# Patient Record
Sex: Female | Born: 1955
Health system: Southern US, Community
[De-identification: ages and names within clinical notes are randomized; demographics above are authoritative.]

## PROBLEM LIST (undated history)

## (undated) DIAGNOSIS — E785 Hyperlipidemia, unspecified: Secondary | ICD-10-CM

## (undated) DIAGNOSIS — F419 Anxiety disorder, unspecified: Secondary | ICD-10-CM

## (undated) DIAGNOSIS — F320 Major depressive disorder, single episode, mild: Secondary | ICD-10-CM

## (undated) HISTORY — DX: Hyperlipidemia, unspecified: E78.5

## (undated) HISTORY — PX: CYSTECTOMY: SUR359

## (undated) HISTORY — DX: Major depressive disorder, single episode, mild: F32.0

## (undated) HISTORY — PX: CARPAL TUNNEL RELEASE: SHX101

## (undated) HISTORY — PX: NERVE REPAIR: SHX2083

---

## 1978-11-28 HISTORY — PX: BONE TUMOR RESECTION: SHX1255

## 1990-03-29 HISTORY — PX: VAGINAL HYSTERECTOMY: SUR661

## 2004-11-12 ENCOUNTER — Ambulatory Visit: Payer: Self-pay | Admitting: Internal Medicine

## 2004-11-17 ENCOUNTER — Ambulatory Visit: Payer: Self-pay | Admitting: Internal Medicine

## 2004-12-18 ENCOUNTER — Ambulatory Visit: Payer: Self-pay | Admitting: Internal Medicine

## 2005-01-01 ENCOUNTER — Ambulatory Visit: Payer: Self-pay | Admitting: Internal Medicine

## 2005-02-03 ENCOUNTER — Ambulatory Visit: Payer: Self-pay | Admitting: Otolaryngology

## 2005-02-14 ENCOUNTER — Emergency Department: Payer: Self-pay | Admitting: General Practice

## 2006-03-04 IMAGING — US US THYROID
1 series · 17 of 25 positions shown · non-contrast
Comparison: none

REASON FOR EXAM: NODULE
COMMENTS:

[Series 1: us thyroid · 17 of 42 slices shown]
[im 1/42]
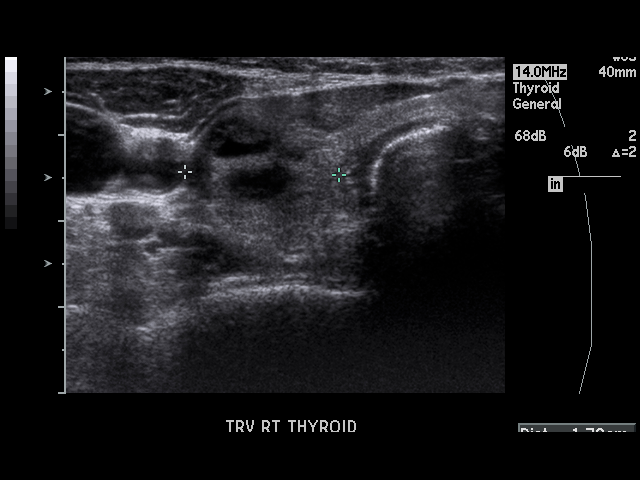
[im 4/42]
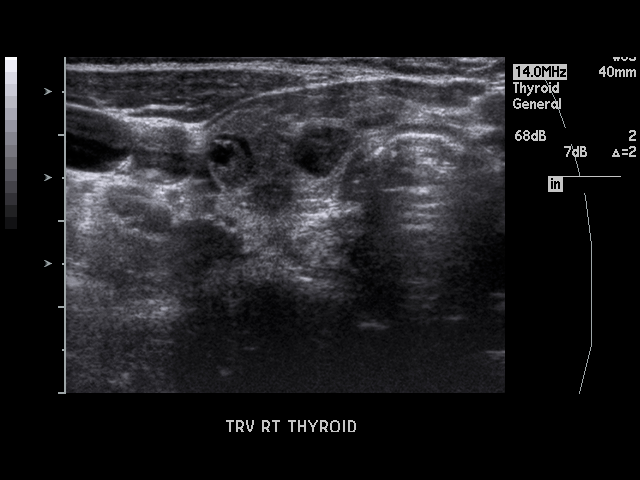
[im 6/42]
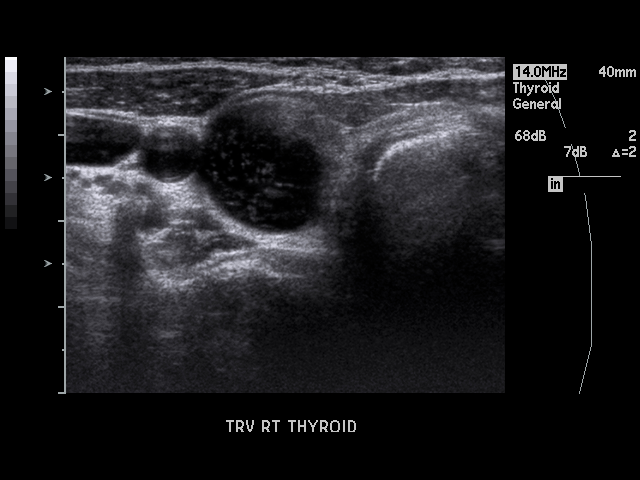
[im 9/42]
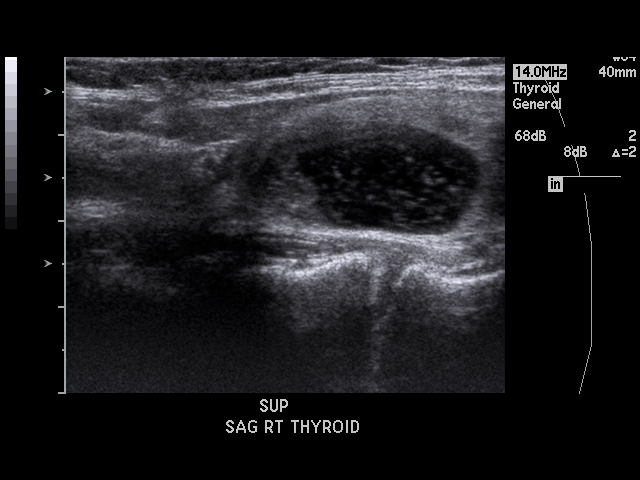
[im 11/42]
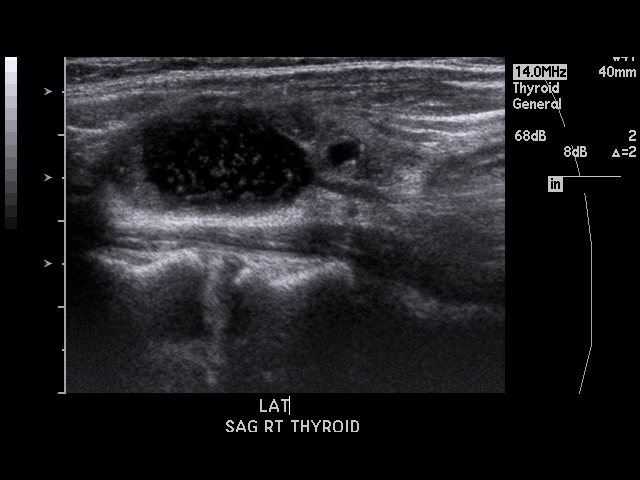
[im 14/42]
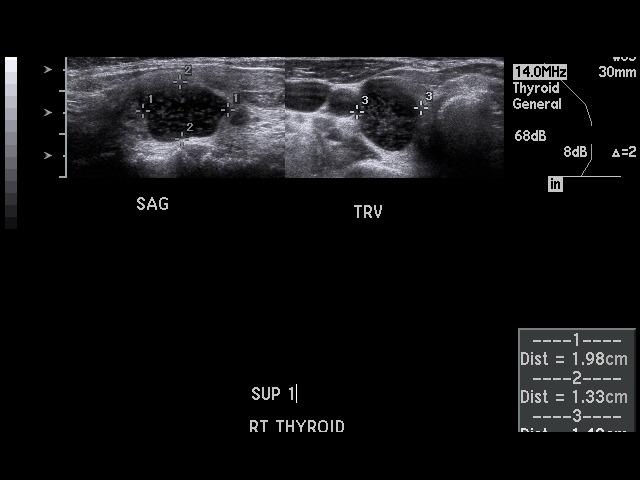
[im 16/42]
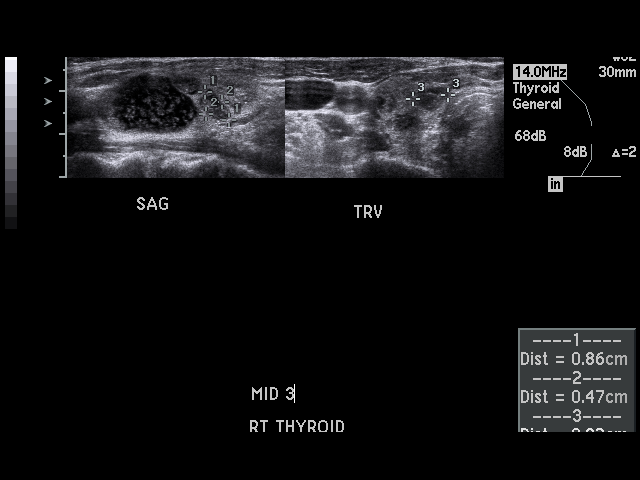
[im 19/42]
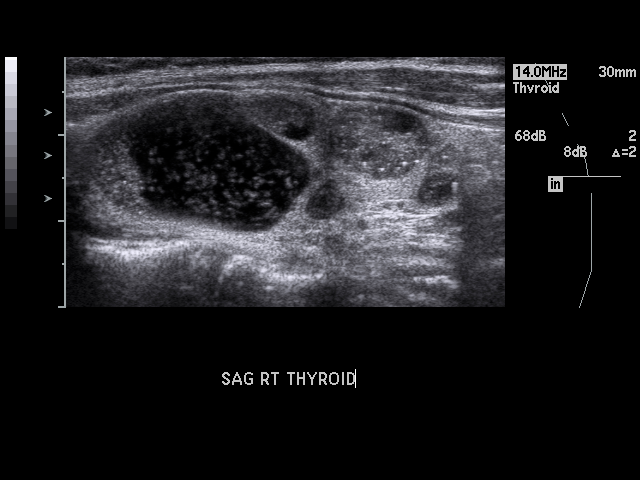
[im 21/42]
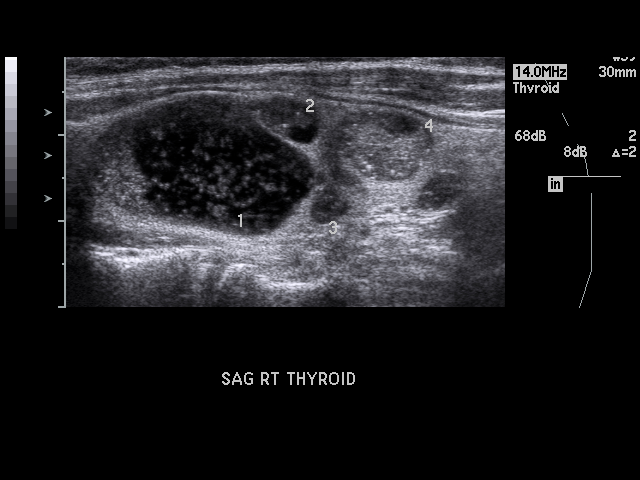
[im 23/42]
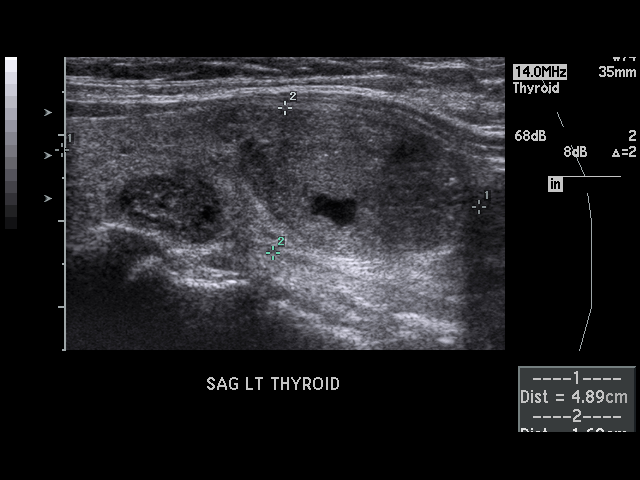
[im 26/42]
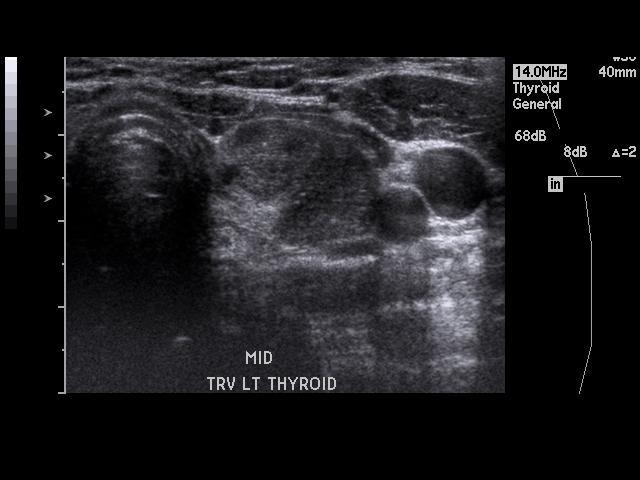
[im 28/42]
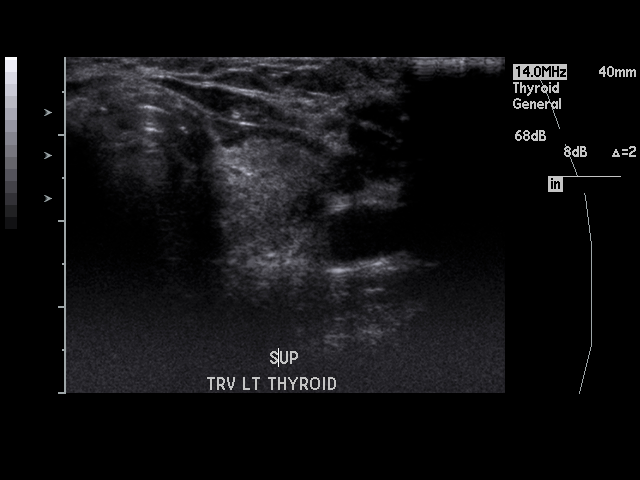
[im 31/42]
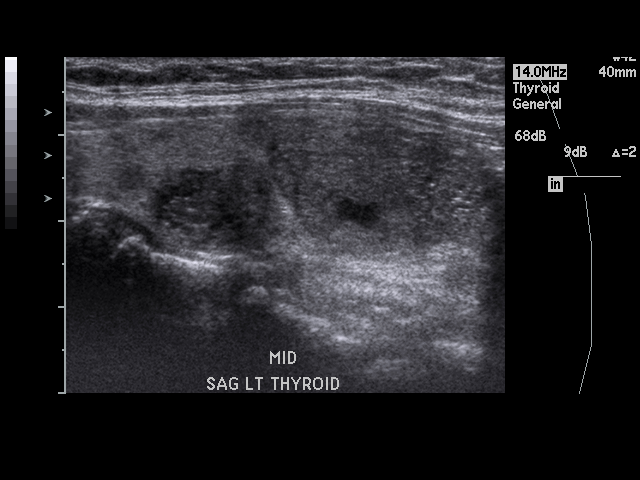
[im 33/42]
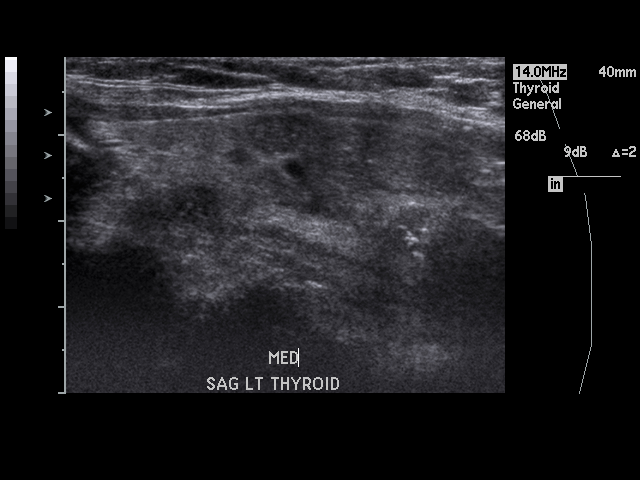
[im 36/42]
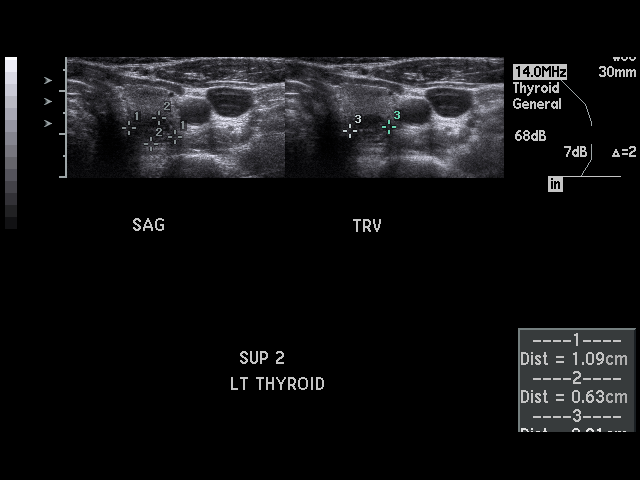
[im 38/42]
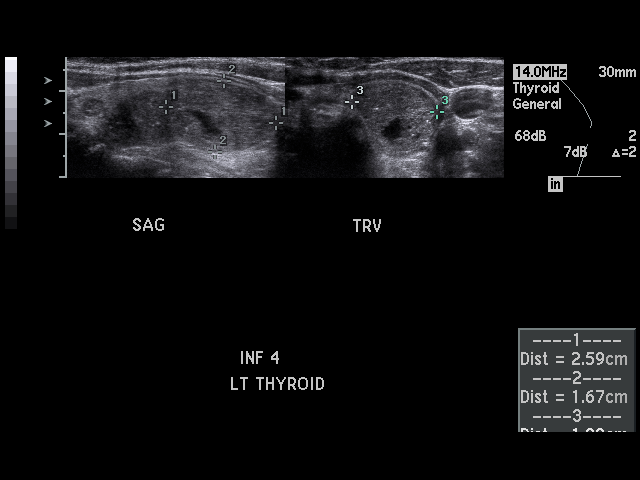
[im 42/42]
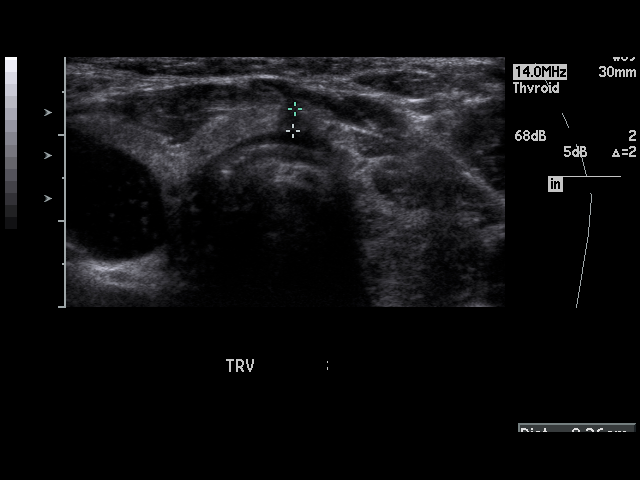

[17 of 25 positions shown; findings below may reference images not displayed]

PROCEDURE:     US  - US THYROID  - February 03, 2005  [DATE]

RESULT:     The RIGHT lobe of the thyroid measures 4.70 cm x 1.92 cm x
cm.  The LEFT lobe measures 4.89 cm x 1.69 cm x 1.89 cm.  In the superior
aspect of the RIGHT lobe there is a hypoechoic nodule having the appearance
of a cyst containing debris and measuring 1.98 cm at maximum diameter. There
are four additional small solid nodules in the RIGHT lobe, which measure
1.19 cm, .86 cm, .77 cm, and .68 cm at maximum diameters, respectively. In
the isthmus there is a 4.2 mm hypoechoic nodule.  In the LEFT lobe there are
noted four nodules with the largest measuring 2.59 cm at maximum diameter.
Additional solid nodules measuring 1.46 cm, 1.09 cm and .66 cm are seen.
IMPRESSION: 1)There are observed multiple thyroid nodules bilaterally as described
above. Additionally, a cystic appearing nodule is noted superiorly in the
RIGHT lobe.

## 2008-01-12 ENCOUNTER — Telehealth (INDEPENDENT_AMBULATORY_CARE_PROVIDER_SITE_OTHER): Payer: Self-pay | Admitting: *Deleted

## 2008-10-30 ENCOUNTER — Emergency Department: Payer: Self-pay | Admitting: Emergency Medicine

## 2008-11-05 ENCOUNTER — Ambulatory Visit: Payer: Self-pay | Admitting: Cardiology

## 2008-11-05 DIAGNOSIS — F411 Generalized anxiety disorder: Secondary | ICD-10-CM | POA: Insufficient documentation

## 2008-11-05 DIAGNOSIS — F17200 Nicotine dependence, unspecified, uncomplicated: Secondary | ICD-10-CM | POA: Insufficient documentation

## 2008-11-05 DIAGNOSIS — R079 Chest pain, unspecified: Secondary | ICD-10-CM | POA: Insufficient documentation

## 2008-11-06 ENCOUNTER — Telehealth (INDEPENDENT_AMBULATORY_CARE_PROVIDER_SITE_OTHER): Payer: Self-pay | Admitting: *Deleted

## 2008-11-07 ENCOUNTER — Ambulatory Visit: Payer: Self-pay

## 2008-11-07 ENCOUNTER — Encounter: Payer: Self-pay | Admitting: Cardiology

## 2008-11-08 ENCOUNTER — Encounter: Payer: Self-pay | Admitting: Cardiology

## 2008-11-13 ENCOUNTER — Encounter: Payer: Self-pay | Admitting: Internal Medicine

## 2008-11-13 ENCOUNTER — Ambulatory Visit: Payer: Self-pay | Admitting: Cardiovascular Disease

## 2008-11-13 DIAGNOSIS — R748 Abnormal levels of other serum enzymes: Secondary | ICD-10-CM | POA: Insufficient documentation

## 2008-11-14 LAB — CONVERTED CEMR LAB
ALT: 73 units/L — ABNORMAL HIGH (ref 0–35)
Albumin: 4.5 g/dL (ref 3.5–5.2)
CO2: 22 meq/L (ref 19–32)
Calcium: 9.6 mg/dL (ref 8.4–10.5)
Chloride: 108 meq/L (ref 96–112)
Cholesterol: 219 mg/dL — ABNORMAL HIGH (ref 0–200)
Glucose, Bld: 69 mg/dL — ABNORMAL LOW (ref 70–99)
MCV: 85.8 fL (ref 78.0–100.0)
Platelets: 258 10*3/uL (ref 150–400)
Potassium: 4.2 meq/L (ref 3.5–5.3)
RBC: 5.08 M/uL (ref 3.87–5.11)
Sodium: 145 meq/L (ref 135–145)
Total Bilirubin: 0.5 mg/dL (ref 0.3–1.2)
Total Protein: 6.8 g/dL (ref 6.0–8.3)
Triglycerides: 83 mg/dL (ref <150)
WBC: 7.3 10*3/uL (ref 4.0–10.5)

## 2008-11-20 ENCOUNTER — Ambulatory Visit: Payer: Self-pay | Admitting: Cardiology

## 2008-11-28 ENCOUNTER — Encounter: Payer: Self-pay | Admitting: Internal Medicine

## 2008-12-20 ENCOUNTER — Ambulatory Visit: Payer: Self-pay | Admitting: Internal Medicine

## 2008-12-20 DIAGNOSIS — E785 Hyperlipidemia, unspecified: Secondary | ICD-10-CM | POA: Insufficient documentation

## 2009-01-10 ENCOUNTER — Encounter: Payer: Self-pay | Admitting: Internal Medicine

## 2009-01-10 ENCOUNTER — Ambulatory Visit: Payer: Self-pay | Admitting: Internal Medicine

## 2009-01-15 ENCOUNTER — Encounter: Payer: Self-pay | Admitting: Internal Medicine

## 2009-02-25 ENCOUNTER — Encounter: Payer: Self-pay | Admitting: Internal Medicine

## 2010-10-13 ENCOUNTER — Encounter: Payer: Self-pay | Admitting: Cardiology

## 2011-07-12 ENCOUNTER — Ambulatory Visit (INDEPENDENT_AMBULATORY_CARE_PROVIDER_SITE_OTHER): Payer: BC Managed Care – PPO | Admitting: Internal Medicine

## 2011-07-12 ENCOUNTER — Encounter: Payer: Self-pay | Admitting: Internal Medicine

## 2011-07-12 VITALS — BP 100/60 | HR 66 | Temp 98.2°F | Ht 62.0 in | Wt 140.0 lb

## 2011-07-12 DIAGNOSIS — Z1231 Encounter for screening mammogram for malignant neoplasm of breast: Secondary | ICD-10-CM

## 2011-07-12 DIAGNOSIS — M7500 Adhesive capsulitis of unspecified shoulder: Secondary | ICD-10-CM

## 2011-07-12 DIAGNOSIS — R5383 Other fatigue: Secondary | ICD-10-CM | POA: Insufficient documentation

## 2011-07-12 DIAGNOSIS — Z23 Encounter for immunization: Secondary | ICD-10-CM

## 2011-07-12 DIAGNOSIS — E785 Hyperlipidemia, unspecified: Secondary | ICD-10-CM

## 2011-07-12 DIAGNOSIS — Z1211 Encounter for screening for malignant neoplasm of colon: Secondary | ICD-10-CM

## 2011-07-12 DIAGNOSIS — F17200 Nicotine dependence, unspecified, uncomplicated: Secondary | ICD-10-CM

## 2011-07-12 DIAGNOSIS — Z0000 Encounter for general adult medical examination without abnormal findings: Secondary | ICD-10-CM | POA: Insufficient documentation

## 2011-07-12 DIAGNOSIS — R5381 Other malaise: Secondary | ICD-10-CM

## 2011-07-12 NOTE — Assessment & Plan Note (Signed)
Seems to be healthy but stressed and overwhelmed with caregiving for parents (in separate apts in [place]) and 2 grandchildren Will get mammo and do stool immunoassay  Will set up biopsy of suspicious lesion

## 2011-07-12 NOTE — Progress Notes (Signed)
Subjective:    Patient ID: Sheena Kennedy, female    DOB: 1955-07-23, 56 y.o.   MRN: [id number]  HPI Here for physical A lot of stress---now has legal custody of her 2 grandchildren Now has to care mom and dad in [place]  Dr Sheena Kennedy checks her eyes Big change and he wants her medically checked Polyuria which may be related to drinking lots of coffee. No polydipsia  Left arm pain Hard lifting arm up ---abduction  Has lost weight Gained some back Doesn't eat right---often just once a day  "I would love to quit" but seems to smoke more when she when tries Doesn't relax her Discussed just quitting  No current outpatient prescriptions on file prior to visit.    No Known Allergies  Past Medical History  Diagnosis Date  . Tobacco abuse   . Chest pain     ETT-myoview (8/10): 7'30", 84% MPHR, stopped due to fatigue with no CP or ECG changes, EF 60%, no evidence for ischemia or infarction on perfusion images  . Hyperlipidemia     Past Surgical History  Procedure Date  . Vaginal hysterectomy 1992    Fibroids  . Bone tumor resection 1980's    Benign in right leg  . Nerve repair     Right forearm  . Carpal tunnel release     Right  . Cystectomy     Right hand    Family History  Problem Relation Age of Onset  . Fibromyalgia Mother   . Colitis Mother   . Heart failure Father     CHF  . Diabetes Brother     DM  . Cancer Paternal Aunt     Breast  . Coronary artery disease Neg Hx   . Heart attack Neg Hx     MI  . Hypertension Other     Multiple family members  . Cancer Other     Colon, distant on dad's side  . Depression Other     Strong hx    History   Social History  . Marital Status: Divorced    Spouse Name: N/A    Number of Children: 2  . Years of Education: N/A   Occupational History  . Comptroller at [employer]    Social History Main Topics  . Smoking status: Current Some Day Smoker -- 1.0 packs/day  . Smokeless tobacco: Never Used   Comment:  Smoked 1 PPD until 8/4, now trying to quit.  . Alcohol Use: Yes     Rare  . Drug Use:   . Sexually Active:    Other Topics Concern  . Not on file   Social History Narrative   Lives in [place] with her 2 grand-children (she has custody).Also lives with her boyfriend.Has her mom and dad in [place]---they need lots of help now also   Review of Systems  Constitutional: Positive for fatigue and unexpected weight change.       Stays tired Wears seat belt  HENT: Negative for hearing loss, congestion, rhinorrhea, dental problem and tinnitus.        Regular with dentist  Eyes: Negative for visual disturbance.       Big refractive change recently  Respiratory: Negative for cough, chest tightness and shortness of breath.   Cardiovascular: Negative for chest pain, palpitations and leg swelling.  Gastrointestinal: Negative for nausea, vomiting, abdominal pain, constipation and blood in stool.       No heartburn  Genitourinary: Negative for dysuria, urgency, difficulty  urinating and dyspareunia.       Occ stress incontinence---occ uses pad  Musculoskeletal: Positive for arthralgias. Negative for back pain and joint swelling.       Recurrent foot pain Left arm pain  Skin: Negative for rash.       Has spot on back she wants checked---feels crusty  Neurological: Positive for dizziness. Negative for syncope, weakness, light-headedness, numbness and headaches.       Occ dizziness if she really gets up quickly from bending  Hematological: Negative for adenopathy. Does not bruise/bleed easily.  Psychiatric/Behavioral: Positive for sleep disturbance. Negative for dysphoric mood. The patient is not nervous/anxious.        Some trouble initiating sleep---awakens fatigued ("I just have to pull myself out of bed") No sig snoring or apnea Has been "feeling sorry for myself lately"        Objective:   Physical Exam  Constitutional: She is oriented to person, place, and time. She appears  well-developed and well-nourished. No distress.  HENT:  Head: Normocephalic and atraumatic.  Right Ear: External ear normal.  Left Ear: External ear normal.  Mouth/Throat: Oropharynx is clear and moist.  Eyes: Conjunctivae and EOM are normal. Pupils are equal, round, and reactive to light.  Neck: Neck supple. No thyromegaly present.  Cardiovascular: Normal rate, regular rhythm, normal heart sounds and intact distal pulses.  Exam reveals no gallop.   No murmur heard. Pulmonary/Chest: Effort normal and breath sounds normal. No respiratory distress. She has no wheezes. She has no rales.  Abdominal: Soft. There is no tenderness.  Genitourinary:       No sig masses or discharge---slight bilateral cystic changes  Musculoskeletal: She exhibits no edema and no tenderness.       Left frozen shoulder  Lymphadenopathy:    She has no cervical adenopathy.  Neurological: She is alert and oriented to person, place, and time.  Skin: No rash noted. No erythema.       Suspicious lesion left upper back Multiple benign nevi  Psychiatric: She has a normal mood and affect. Her behavior is normal.       Stress and frustrated          Assessment & Plan:

## 2011-07-12 NOTE — Patient Instructions (Addendum)
Please try melatonin-- 3-6 mg at bedtime---to see if it helps your sleep  Please set up 15 minute visit for skin biopsy within the next couple of weeks

## 2011-07-12 NOTE — Assessment & Plan Note (Signed)
No meds now Will recheck levels

## 2011-07-12 NOTE — Assessment & Plan Note (Signed)
Discussed quitting but doesn't seem ready till stress quiets

## 2011-07-12 NOTE — Assessment & Plan Note (Signed)
Probably from stress and poor sleep Discussed sleep hygiene  Check labs

## 2011-07-12 NOTE — Assessment & Plan Note (Signed)
Will refer to Dr Kennith Center

## 2011-07-13 LAB — CBC WITH DIFFERENTIAL/PLATELET
Basophils Absolute: 0 10*3/uL (ref 0.0–0.1)
Basophils Relative: 0.5 % (ref 0.0–3.0)
Eosinophils Absolute: 0.2 10*3/uL (ref 0.0–0.7)
HCT: 42.7 % (ref 36.0–46.0)
Hemoglobin: 14.3 g/dL (ref 12.0–15.0)
Lymphs Abs: 2.8 10*3/uL (ref 0.7–4.0)
MCHC: 33.5 g/dL (ref 30.0–36.0)
Monocytes Relative: 6.1 % (ref 3.0–12.0)
Neutro Abs: 3 10*3/uL (ref 1.4–7.7)
RBC: 4.86 Mil/uL (ref 3.87–5.11)
RDW: 12.7 % (ref 11.5–14.6)

## 2011-07-13 LAB — HEPATIC FUNCTION PANEL
ALT: 23 U/L (ref 0–35)
Albumin: 4.2 g/dL (ref 3.5–5.2)
Alkaline Phosphatase: 84 U/L (ref 39–117)
Bilirubin, Direct: 0.1 mg/dL (ref 0.0–0.3)
Total Protein: 7 g/dL (ref 6.0–8.3)

## 2011-07-13 LAB — BASIC METABOLIC PANEL
BUN: 11 mg/dL (ref 6–23)
Chloride: 106 mEq/L (ref 96–112)
GFR: 87.92 mL/min (ref >60.00)
Glucose, Bld: 77 mg/dL (ref 70–99)
Potassium: 3.8 mEq/L (ref 3.5–5.1)
Sodium: 142 mEq/L (ref 135–145)

## 2011-07-13 LAB — LIPID PANEL: HDL: 40.6 mg/dL (ref >39.00)

## 2011-07-13 LAB — LDL CHOLESTEROL, DIRECT: Direct LDL: 166.3 mg/dL

## 2011-07-19 ENCOUNTER — Encounter: Payer: Self-pay | Admitting: *Deleted

## 2011-07-20 ENCOUNTER — Telehealth: Payer: Self-pay | Admitting: Internal Medicine

## 2011-07-20 NOTE — Telephone Encounter (Signed)
Pt is needing a referral for a Bone Density Test. She uses [facility]

## 2011-07-20 NOTE — Telephone Encounter (Signed)
I really don't recommend screening bone density tests till age 55 except in high risk patients (like hyperparathyroidism, steroid treatment or unexpected fracture) Does she have a particular concern?

## 2011-07-20 NOTE — Telephone Encounter (Signed)
.  left message to have patient return my call, also left detailed message on VM.

## 2011-07-21 ENCOUNTER — Other Ambulatory Visit (INDEPENDENT_AMBULATORY_CARE_PROVIDER_SITE_OTHER): Payer: BC Managed Care – PPO

## 2011-07-21 DIAGNOSIS — E0590 Thyrotoxicosis, unspecified without thyrotoxic crisis or storm: Secondary | ICD-10-CM

## 2011-07-21 LAB — TSH: TSH: 0.09 u[IU]/mL — ABNORMAL LOW (ref 0.35–5.50)

## 2011-07-22 ENCOUNTER — Encounter: Payer: Self-pay | Admitting: *Deleted

## 2011-08-06 ENCOUNTER — Other Ambulatory Visit: Payer: Self-pay | Admitting: Internal Medicine

## 2011-08-06 ENCOUNTER — Encounter: Payer: Self-pay | Admitting: Internal Medicine

## 2011-08-06 ENCOUNTER — Ambulatory Visit (INDEPENDENT_AMBULATORY_CARE_PROVIDER_SITE_OTHER): Payer: BC Managed Care – PPO | Admitting: Internal Medicine

## 2011-08-06 VITALS — BP 102/60 | HR 68 | Wt 142.0 lb

## 2011-08-06 DIAGNOSIS — L989 Disorder of the skin and subcutaneous tissue, unspecified: Secondary | ICD-10-CM | POA: Insufficient documentation

## 2011-08-06 NOTE — Progress Notes (Signed)
Addended by: Sueanne Margarita on: 08/06/2011 02:50 PM   Modules accepted: Orders

## 2011-08-06 NOTE — Assessment & Plan Note (Signed)
Suspicious  Sterile prep Local anaesthesia with 1% lidocaine with epi 6mm punch biopsy done Hemostasis with silver nitrate Covered with neosporin/bandaid To pathology

## 2011-08-06 NOTE — Progress Notes (Signed)
  Subjective:    Patient ID: Sheena Kennedy, female    DOB: 1955-12-26, 56 y.o.   MRN: [id number]  HPI Here for biopsy of suspicious left back lesion ~10x 8mm   Review of Systems     Objective:   Physical Exam        Assessment & Plan:

## 2011-08-13 ENCOUNTER — Ambulatory Visit: Payer: Self-pay | Admitting: Internal Medicine

## 2011-08-16 ENCOUNTER — Encounter: Payer: Self-pay | Admitting: *Deleted

## 2011-12-24 ENCOUNTER — Ambulatory Visit (INDEPENDENT_AMBULATORY_CARE_PROVIDER_SITE_OTHER): Payer: BC Managed Care – PPO | Admitting: Family Medicine

## 2011-12-24 ENCOUNTER — Encounter: Payer: Self-pay | Admitting: Family Medicine

## 2011-12-24 VITALS — BP 120/76 | HR 92 | Temp 98.6°F | Wt 146.0 lb

## 2011-12-24 DIAGNOSIS — L723 Sebaceous cyst: Secondary | ICD-10-CM

## 2011-12-24 DIAGNOSIS — L089 Local infection of the skin and subcutaneous tissue, unspecified: Secondary | ICD-10-CM

## 2011-12-24 NOTE — Patient Instructions (Addendum)
Pull some of the packing daily and then trim it as needed.  Wash the area with soap and water.  It should gradually heal.  Take care.   Infected sebaceous cyst.

## 2011-12-24 NOTE — Progress Notes (Signed)
Painful area on her back, right upper side.  More tender and swollen and last week.  No fevers.    I&D  Meds, vitals, and allergies reviewed.   Indication: suspect abscess vs infected seb cyst.    Pt complaints of: erythema, pain, swelling  Location: R upper back  Size: 1.5cm  Informed consent obtained.  Pt aware of risks not limited to but including infection, bleeding, damage to near by organs.  Prep: etoh/betadine  Anesthesia: 2% lidocaine with epi, good effect  Incision made with #11 blade  Would explored and loculations removed  Cyst wall removed.   Wound packed with iodoform gauze  Tolerated well  Routine postprocedure instructions d/w pt- remove packing in 24-48h, keep area clean and bandaged, follow up if concerns/spreading erythema/pain.

## 2011-12-27 DIAGNOSIS — L723 Sebaceous cyst: Secondary | ICD-10-CM | POA: Insufficient documentation

## 2011-12-27 DIAGNOSIS — L089 Local infection of the skin and subcutaneous tissue, unspecified: Secondary | ICD-10-CM | POA: Insufficient documentation

## 2011-12-27 NOTE — Assessment & Plan Note (Signed)
Routine postprocedure instructions d/w pt- remove packing gradually, keep area clean and bandaged, follow up if concerns/spreading erythema/pain.  Should resolve.  F/u prn.  She agrees.  No complications.

## 2013-02-19 ENCOUNTER — Encounter: Payer: BC Managed Care – PPO | Admitting: Internal Medicine

## 2013-06-11 ENCOUNTER — Ambulatory Visit (INDEPENDENT_AMBULATORY_CARE_PROVIDER_SITE_OTHER): Payer: PRIVATE HEALTH INSURANCE | Admitting: Internal Medicine

## 2013-06-11 ENCOUNTER — Encounter: Payer: Self-pay | Admitting: Internal Medicine

## 2013-06-11 VITALS — BP 106/68 | HR 68 | Temp 97.6°F | Ht 62.75 in | Wt 181.5 lb

## 2013-06-11 DIAGNOSIS — Z1211 Encounter for screening for malignant neoplasm of colon: Secondary | ICD-10-CM

## 2013-06-11 DIAGNOSIS — E785 Hyperlipidemia, unspecified: Secondary | ICD-10-CM

## 2013-06-11 DIAGNOSIS — Z0000 Encounter for general adult medical examination without abnormal findings: Secondary | ICD-10-CM

## 2013-06-11 LAB — COMPREHENSIVE METABOLIC PANEL
ALBUMIN: 4.3 g/dL (ref 3.5–5.2)
ALT: 25 U/L (ref 0–35)
AST: 19 U/L (ref 0–37)
Alkaline Phosphatase: 93 U/L (ref 39–117)
BUN: 12 mg/dL (ref 6–23)
CO2: 33 meq/L — AB (ref 19–32)
Calcium: 9.5 mg/dL (ref 8.4–10.5)
Chloride: 105 mEq/L (ref 96–112)
Creatinine, Ser: 0.9 mg/dL (ref 0.4–1.2)
GFR: 71.31 mL/min (ref >60.00)
GLUCOSE: 93 mg/dL (ref 70–99)
POTASSIUM: 3.8 meq/L (ref 3.5–5.1)
SODIUM: 143 meq/L (ref 135–145)
TOTAL PROTEIN: 7.7 g/dL (ref 6.0–8.3)
Total Bilirubin: 0.5 mg/dL (ref 0.3–1.2)

## 2013-06-11 LAB — CBC WITH DIFFERENTIAL/PLATELET
Basophils Absolute: 0 10*3/uL (ref 0.0–0.1)
Basophils Relative: 0.3 % (ref 0.0–3.0)
EOS PCT: 3.8 % (ref 0.0–5.0)
Eosinophils Absolute: 0.3 10*3/uL (ref 0.0–0.7)
HCT: 44.1 % (ref 36.0–46.0)
Hemoglobin: 14.5 g/dL (ref 12.0–15.0)
LYMPHS PCT: 37.9 % (ref 12.0–46.0)
Lymphs Abs: 2.8 10*3/uL (ref 0.7–4.0)
MCHC: 32.8 g/dL (ref 30.0–36.0)
MCV: 85.4 fl (ref 78.0–100.0)
MONOS PCT: 5.8 % (ref 3.0–12.0)
Monocytes Absolute: 0.4 10*3/uL (ref 0.1–1.0)
NEUTROS PCT: 52.2 % (ref 43.0–77.0)
Neutro Abs: 3.8 10*3/uL (ref 1.4–7.7)
PLATELETS: 273 10*3/uL (ref 150.0–400.0)
RBC: 5.16 Mil/uL — ABNORMAL HIGH (ref 3.87–5.11)
RDW: 13.3 % (ref 11.5–14.6)
WBC: 7.3 10*3/uL (ref 4.5–10.5)

## 2013-06-11 LAB — LIPID PANEL
CHOLESTEROL: 250 mg/dL — AB (ref 0–200)
HDL: 47.7 mg/dL (ref >39.00)
LDL Cholesterol: 180 mg/dL — ABNORMAL HIGH (ref 0–99)
TRIGLYCERIDES: 113 mg/dL (ref 0.0–149.0)
Total CHOL/HDL Ratio: 5
VLDL: 22.6 mg/dL (ref 0.0–40.0)

## 2013-06-11 LAB — TSH: TSH: 0.04 u[IU]/mL — ABNORMAL LOW (ref 0.35–5.50)

## 2013-06-11 LAB — T4, FREE: FREE T4: 0.68 ng/dL (ref 0.60–1.60)

## 2013-06-11 NOTE — Assessment & Plan Note (Signed)
No meds for primary prevention Will recheck labs

## 2013-06-11 NOTE — Progress Notes (Signed)
Pre visit review using our clinic review tool, if applicable. No additional management support is needed unless otherwise documented below in the visit note. 

## 2013-06-11 NOTE — Assessment & Plan Note (Signed)
Healthy but out of shape congrats on stopping smoking Discussed lifestyle Due for mammo Will do fecal immunoassay

## 2013-06-11 NOTE — Progress Notes (Signed)
Subjective:    Patient ID: Sheena Kennedy, female    DOB: [birth year], 57 y.o.   MRN: [id number]  HPI Here for physical Ongoing stress Still cares for 2 grandchildren in her home. Moved parents to next door---still need constant care Brother does come and give some help at times (from [place]) Did stop smoking Gained 35# though---no time to exercise Knee pain limits her some Doesn't eat healthy  No other medical concerns  No current outpatient prescriptions on file prior to visit.   No current facility-administered medications on file prior to visit.    No Known Allergies  Past Medical History  Diagnosis Date  . Chest pain     ETT-myoview (8/10): 7'30", 84% MPHR, stopped due to fatigue with no CP or ECG changes, EF 60%, no evidence for ischemia or infarction on perfusion images  . Hyperlipidemia     Past Surgical History  Procedure Laterality Date  . Vaginal hysterectomy  1992    Fibroids  . Bone tumor resection  1980's    Benign in right leg  . Nerve repair      Right forearm  . Carpal tunnel release      Right  . Cystectomy      Right hand    Family History  Problem Relation Age of Onset  . Fibromyalgia Mother   . Colitis Mother   . Heart failure Father     CHF  . Diabetes Brother     DM  . Cancer Paternal Aunt     Breast  . Coronary artery disease Neg Hx   . Heart attack Neg Hx     MI  . Hypertension Other     Multiple family members  . Cancer Other     Colon, distant on dad's side  . Depression Other     Strong hx    History   Social History  . Marital Status: Divorced    Spouse Name: N/A    Number of Children: 2  . Years of Education: N/A   Occupational History  . Comptroller at [employer]    Social History Main Topics  . Smoking status: Former Smoker -- 1.00 packs/day  . Smokeless tobacco: Never Used     Comment: quit 11/27/11  . Alcohol Use: Yes     Comment: Rare  . Drug Use: No  . Sexual Activity: Not on file   Other  Topics Concern  . Not on file   Social History Narrative   Lives in [place] with her 2 grand-children (she has custody).   Also lives with her boyfriend--out of town a lot.   Has her mom and dad to care for---now next door.   Review of Systems  Constitutional: Positive for unexpected weight change.       Wears seat belt  HENT: Negative for dental problem, hearing loss and tinnitus.        Regular with dentist  Eyes: Positive for visual disturbance.       Cataracts on eyes--no unilateral vision loss Some trouble with night vision while driving  Respiratory: Negative for cough, chest tightness and shortness of breath.   Cardiovascular: Negative for chest pain, palpitations and leg swelling.  Gastrointestinal: Negative for nausea, abdominal pain, constipation and blood in stool.       Heartburn since stopping smoking-- uses tums bid  Endocrine: Positive for cold intolerance. Negative for heat intolerance.  Genitourinary: Negative for dyspareunia.       Some urge  incontinence--discussed Kegels and scheduled voiding  Musculoskeletal: Positive for arthralgias and back pain. Negative for joint swelling.       Mostly knees Rare back pain  Skin: Negative for rash.       Dry skin  Allergic/Immunologic: Negative for environmental allergies and immunocompromised state.  Neurological: Negative for dizziness, syncope, weakness, light-headedness and numbness.       Frequent headaches in past month---usually go away on their own or with ibuprofen  Hematological: Negative for adenopathy. Does not bruise/bleed easily.  Psychiatric/Behavioral: Positive for sleep disturbance and dysphoric mood. The patient is not nervous/anxious.        Chronic sleep problems Episodic mood problems--snaps at kids easier Not anhedonic       Objective:   Physical Exam  Constitutional: She is oriented to person, place, and time. She appears well-developed and well-nourished. No distress.  HENT:  Head:  Normocephalic and atraumatic.  Right Ear: External ear normal.  Left Ear: External ear normal.  Mouth/Throat: Oropharynx is clear and moist. No oropharyngeal exudate.  Eyes: Conjunctivae and EOM are normal. Pupils are equal, round, and reactive to light.  Neck: Normal range of motion. Neck supple. No thyromegaly present.  Cardiovascular: Normal rate, regular rhythm, normal heart sounds and intact distal pulses.  Exam reveals no gallop.   No murmur heard. Pulmonary/Chest: Effort normal and breath sounds normal. No respiratory distress. She has no wheezes. She has no rales.  Abdominal: Soft. There is no tenderness.  Genitourinary:  very dense breasts but no masses  Musculoskeletal: She exhibits no edema and no tenderness.  Lymphadenopathy:    She has no cervical adenopathy.    She has no axillary adenopathy.  Neurological: She is alert and oriented to person, place, and time.  Skin: No rash noted. No erythema.  Psychiatric: She has a normal mood and affect. Her behavior is normal.          Assessment & Plan:

## 2013-06-11 NOTE — Patient Instructions (Addendum)
Please set up your screening mammogram.  Exercise to Lose Weight Exercise and a healthy diet may help you lose weight. Your doctor may suggest specific exercises. EXERCISE IDEAS AND TIPS  Choose low-cost things you enjoy doing, such as walking, bicycling, or exercising to workout videos.  Take stairs instead of the elevator.  Walk during your lunch break.  Park your car further away from work or school.  Go to a gym or an exercise class.  Start with 5 to 10 minutes of exercise each day. Build up to 30 minutes of exercise 4 to 6 days a week.  Wear shoes with good support and comfortable clothes.  Stretch before and after working out.  Work out until you breathe harder and your heart beats faster.  Drink extra water when you exercise.  Do not do so much that you hurt yourself, feel dizzy, or get very short of breath. Exercises that burn about 150 calories:  Running 1  miles in 15 minutes.  Playing volleyball for 45 to 60 minutes.  Washing and waxing a car for 45 to 60 minutes.  Playing touch football for 45 minutes.  Walking 1  miles in 35 minutes.  Pushing a stroller 1  miles in 30 minutes.  Playing basketball for 30 minutes.  Raking leaves for 30 minutes.  Bicycling 5 miles in 30 minutes.  Walking 2 miles in 30 minutes.  Dancing for 30 minutes.  Shoveling snow for 15 minutes.  Swimming laps for 20 minutes.  Walking up stairs for 15 minutes.  Bicycling 4 miles in 15 minutes.  Gardening for 30 to 45 minutes.  Jumping rope for 15 minutes.  Washing windows or floors for 45 to 60 minutes. Document Released: 04/17/2010 Document Revised: 06/07/2011 Document Reviewed: 04/17/2010 ExitCare Patient Information 2014 [employer] DASH Diet The DASH diet stands for "Dietary Approaches to Stop Hypertension." It is a healthy eating plan that has been shown to reduce high blood pressure (hypertension) in as little as 14 days, while also possibly providing  other significant health benefits. These other health benefits include reducing the risk of breast cancer after menopause and reducing the risk of type 2 diabetes, heart disease, colon cancer, and stroke. Health benefits also include weight loss and slowing kidney failure in patients with chronic kidney disease.  DIET GUIDELINES  Limit salt (sodium). Your diet should contain less than 1500 mg of sodium daily.  Limit refined or processed carbohydrates. Your diet should include mostly whole grains. Desserts and added sugars should be used sparingly.  Include small amounts of heart-healthy fats. These types of fats include nuts, oils, and tub margarine. Limit saturated and trans fats. These fats have been shown to be harmful in the body. CHOOSING FOODS  The following food groups are based on a 2000 calorie diet. See your Registered Dietitian for individual calorie needs. Grains and Grain Products (6 to 8 servings daily)  Eat More Often: Whole-wheat bread, brown rice, whole-grain or wheat pasta, quinoa, popcorn without added fat or salt (air popped).  Eat Less Often: White bread, white pasta, white rice, cornbread. Vegetables (4 to 5 servings daily)  Eat More Often: Fresh, frozen, and canned vegetables. Vegetables may be raw, steamed, roasted, or grilled with a minimal amount of fat.  Eat Less Often/Avoid: Creamed or fried vegetables. Vegetables in a cheese sauce. Fruit (4 to 5 servings daily)  Eat More Often: All fresh, canned (in natural juice), or frozen fruits. Dried fruits without added sugar. One hundred percent fruit   juice ( cup [237 mL] daily).  Eat Less Often: Dried fruits with added sugar. Canned fruit in light or heavy syrup. Lean Meats, Fish, and Poultry (2 servings or less daily. One serving is 3 to 4 oz [85-114 g]).  Eat More Often: Ninety percent or leaner ground beef, tenderloin, sirloin. Round cuts of beef, chicken breast, turkey breast. All fish. Grill, bake, or broil your  meat. Nothing should be fried.  Eat Less Often/Avoid: Fatty cuts of meat, turkey, or chicken leg, thigh, or wing. Fried cuts of meat or fish. Dairy (2 to 3 servings)  Eat More Often: Low-fat or fat-free milk, low-fat plain or light yogurt, reduced-fat or part-skim cheese.  Eat Less Often/Avoid: Milk (whole, 2%).Whole milk yogurt. Full-fat cheeses. Nuts, Seeds, and Legumes (4 to 5 servings per week)  Eat More Often: All without added salt.  Eat Less Often/Avoid: Salted nuts and seeds, canned beans with added salt. Fats and Sweets (limited)  Eat More Often: Vegetable oils, tub margarines without trans fats, sugar-free gelatin. Mayonnaise and salad dressings.  Eat Less Often/Avoid: Coconut oils, palm oils, butter, stick margarine, cream, half and half, cookies, candy, pie. FOR MORE INFORMATION The Dash Diet Eating Plan: www.dashdiet.org Document Released: 03/04/2011 Document Revised: 06/07/2011 Document Reviewed: 03/04/2011 ExitCare Patient Information 2014 [employer].  

## 2013-06-15 ENCOUNTER — Encounter: Payer: Self-pay | Admitting: *Deleted

## 2014-11-06 ENCOUNTER — Other Ambulatory Visit: Payer: Self-pay | Admitting: Internal Medicine

## 2014-11-06 DIAGNOSIS — Z1231 Encounter for screening mammogram for malignant neoplasm of breast: Secondary | ICD-10-CM

## 2014-11-21 ENCOUNTER — Ambulatory Visit
Admission: RE | Admit: 2014-11-21 | Discharge: 2014-11-21 | Disposition: A | Discharge status: Home / Self Care | Payer: 59 | Source: Ambulatory Visit | Attending: Internal Medicine | Admitting: Internal Medicine

## 2014-11-21 DIAGNOSIS — Z1231 Encounter for screening mammogram for malignant neoplasm of breast: Secondary | ICD-10-CM | POA: Insufficient documentation

## 2014-11-22 ENCOUNTER — Encounter: Payer: Self-pay | Admitting: *Deleted

## 2015-12-20 IMAGING — MG MM DIGITAL SCREENING BILATERAL
5 series · 5 of 5 positions shown · non-contrast
Comparison: Previous exam(s).

CLINICAL DATA: Screening.

EXAM:
DIGITAL SCREENING BILATERAL MAMMOGRAM WITH CAD

[R MLO (1 of 2)]
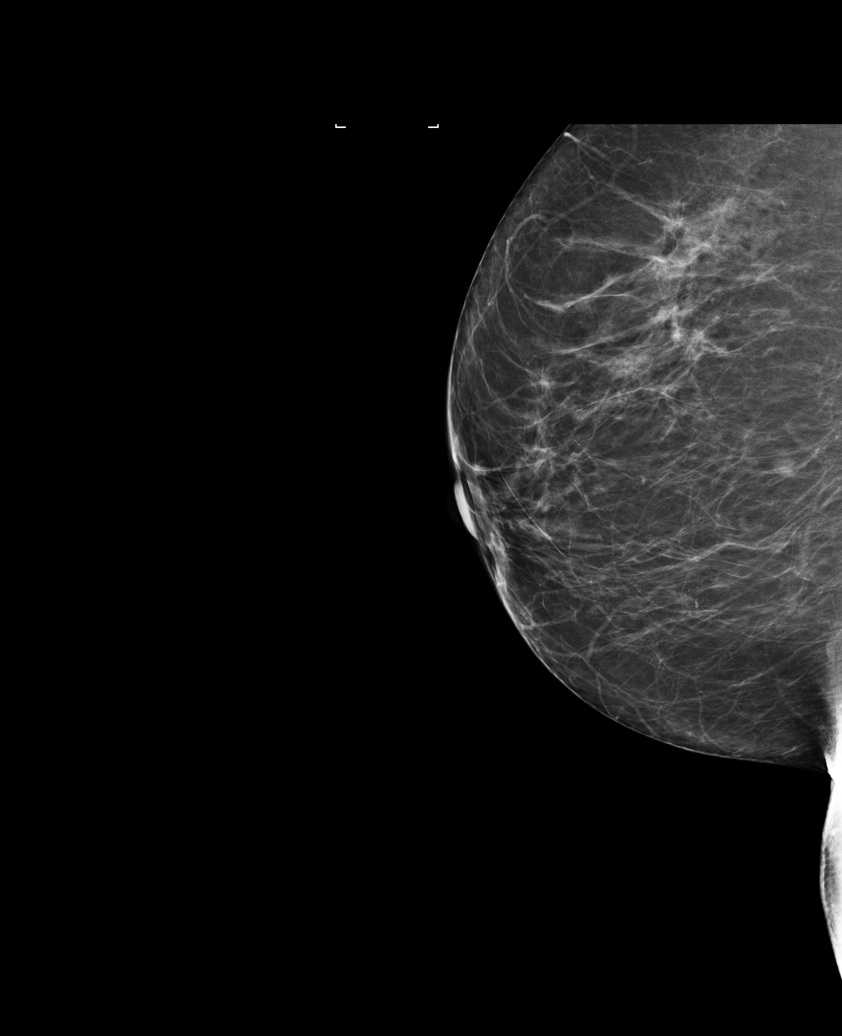

[R CC]
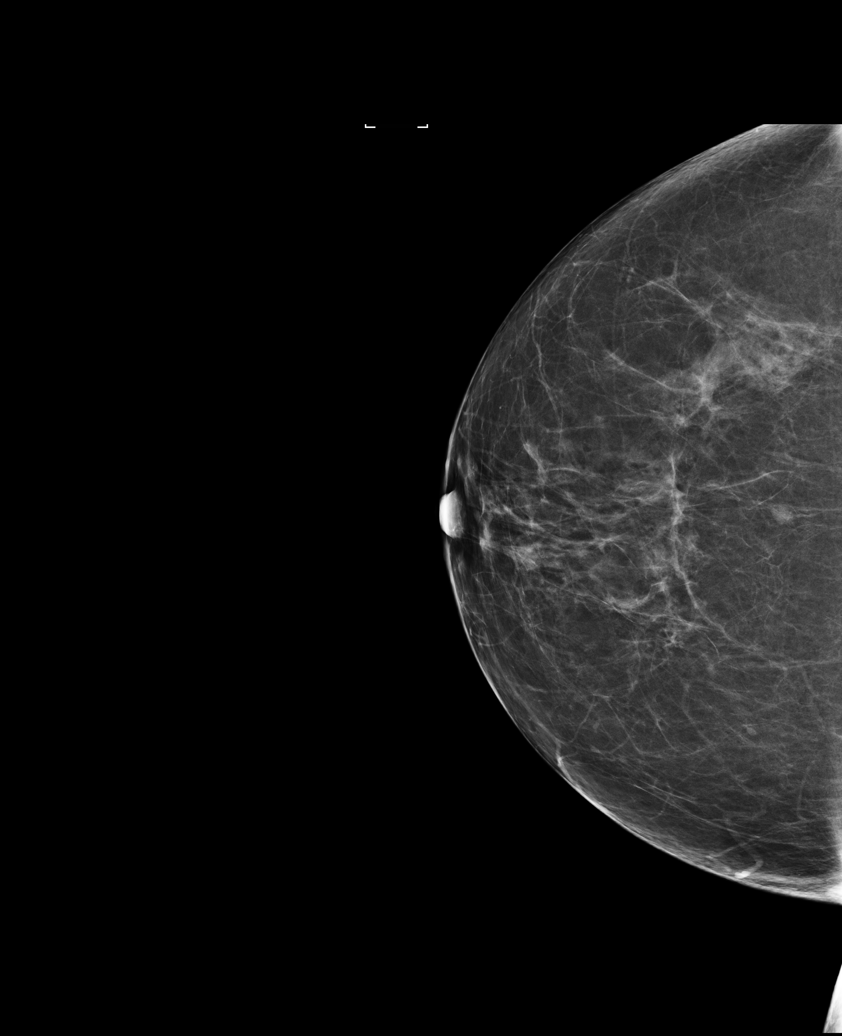

[L MLO]
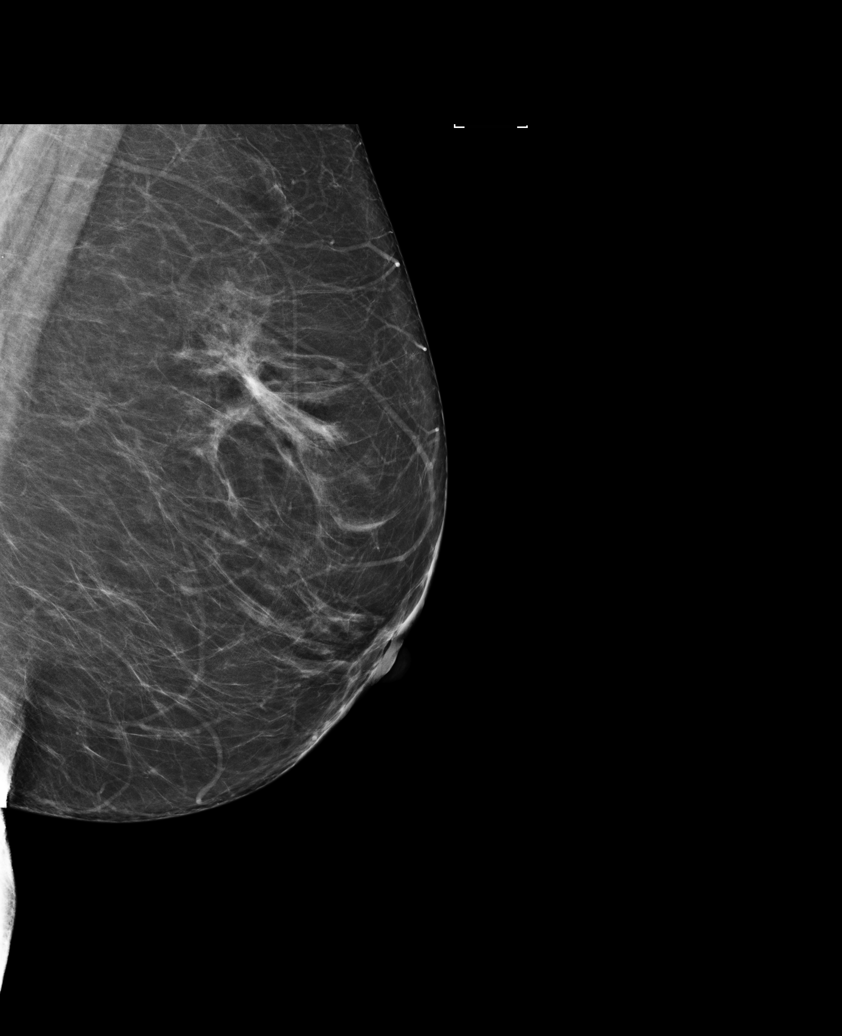

[R MLO (2 of 2)]
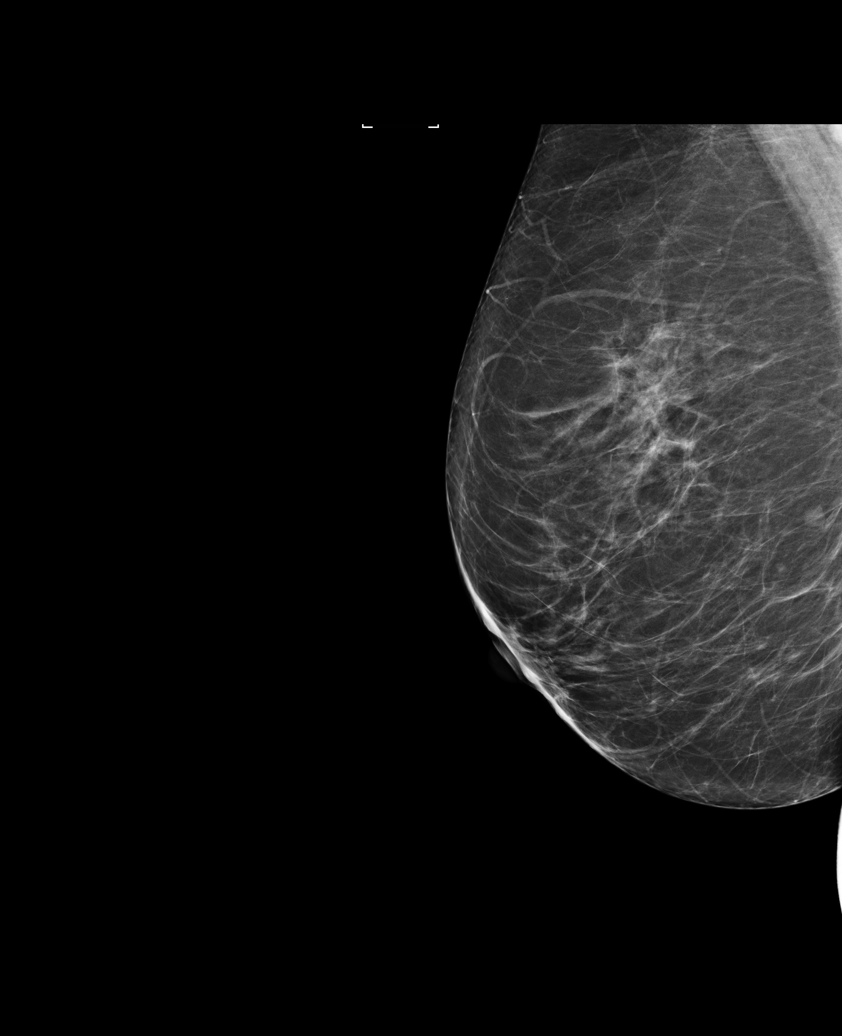

[L CC]
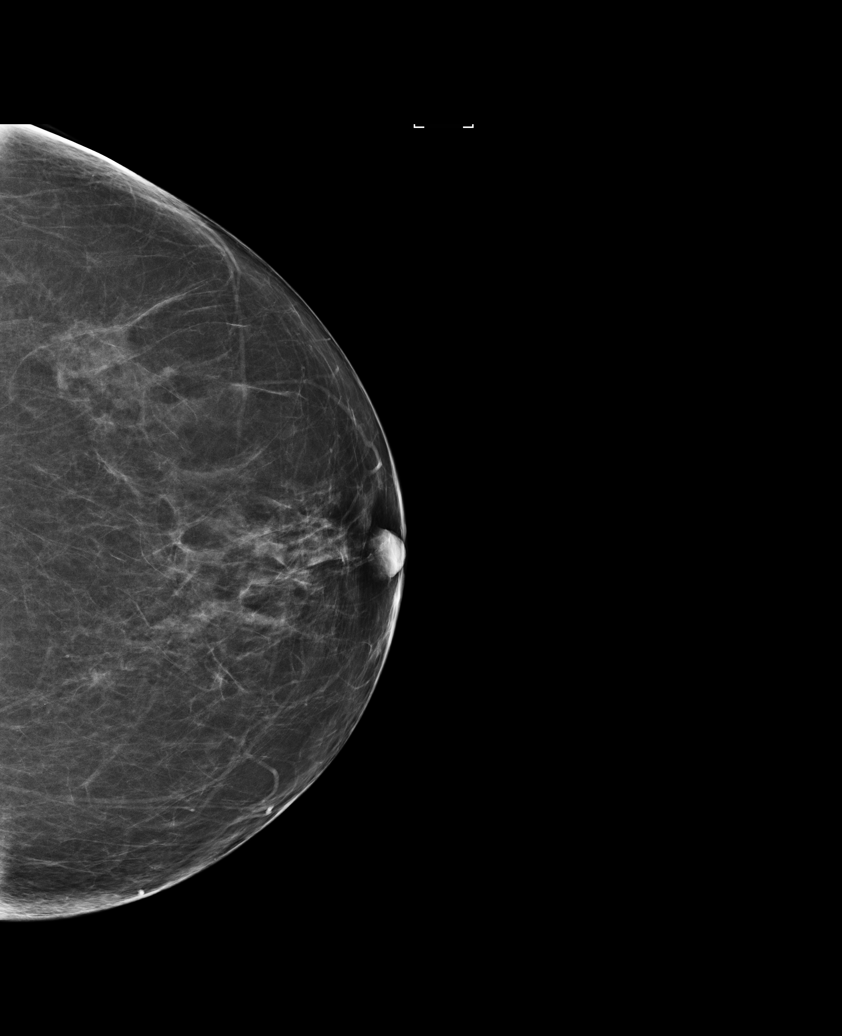

[5 of 5 positions shown; findings below may reference images not displayed]

ACR Breast Density Category c: The breast tissue is heterogeneously
dense, which may obscure small masses.
FINDINGS: There are no findings suspicious for malignancy. Images were
processed with CAD.
IMPRESSION: No mammographic evidence of malignancy. A result letter of this
screening mammogram will be mailed directly to the patient.

RECOMMENDATION:
Screening mammogram in one year. (Code:[id number])

BI-RADS CATEGORY  1: Negative.

## 2017-06-20 ENCOUNTER — Encounter: Payer: Self-pay | Admitting: Internal Medicine

## 2017-06-20 ENCOUNTER — Ambulatory Visit: Payer: 59 | Admitting: Internal Medicine

## 2017-06-20 VITALS — BP 122/74 | HR 75 | Temp 97.9°F | Ht 62.5 in | Wt 194.0 lb

## 2017-06-20 DIAGNOSIS — Z0000 Encounter for general adult medical examination without abnormal findings: Secondary | ICD-10-CM | POA: Diagnosis not present

## 2017-06-20 DIAGNOSIS — Z1211 Encounter for screening for malignant neoplasm of colon: Secondary | ICD-10-CM

## 2017-06-20 DIAGNOSIS — R5383 Other fatigue: Secondary | ICD-10-CM | POA: Diagnosis not present

## 2017-06-20 DIAGNOSIS — F320 Major depressive disorder, single episode, mild: Secondary | ICD-10-CM | POA: Diagnosis not present

## 2017-06-20 DIAGNOSIS — K219 Gastro-esophageal reflux disease without esophagitis: Secondary | ICD-10-CM | POA: Insufficient documentation

## 2017-06-20 DIAGNOSIS — E785 Hyperlipidemia, unspecified: Secondary | ICD-10-CM

## 2017-06-20 MED ORDER — FLUOXETINE HCL 20 MG PO TABS: 20.0000 mg | ORAL_TABLET | Freq: Every day | ORAL | 3 refills | Status: DC

## 2017-06-20 NOTE — Assessment & Plan Note (Signed)
Will try fluoxetine since it is activating Discussed sleep issues and suicidal ideation Early follow up

## 2017-06-20 NOTE — Assessment & Plan Note (Signed)
Hard to work on lifestyle with her mood DASH info Try to get out for some exercise FIT She has appt for mammo No pap due to hyster

## 2017-06-20 NOTE — Assessment & Plan Note (Signed)
Almost certainly related to mood Will check blood work

## 2017-06-20 NOTE — Assessment & Plan Note (Signed)
Discussed trying H2 blockers
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
## 2017-06-20 NOTE — Assessment & Plan Note (Signed)
Would not treat at this point--but recheck

## 2017-06-20 NOTE — Patient Instructions (Addendum)
Please try ranitidine 150mg  or famotidine 20mg  twice a day ---and then you can still use a tums if you need to    DASH Eating Plan DASH stands for "Dietary Approaches to Stop Hypertension." The DASH eating plan is a healthy eating plan that has been shown to reduce high blood pressure (hypertension). It may also reduce your risk for type 2 diabetes, heart disease, and stroke. The DASH eating plan may also help with weight loss. What are tips for following this plan? General guidelines  Avoid eating more than 2,300 mg (milligrams) of salt (sodium) a day. If you have hypertension, you may need to reduce your sodium intake to 1,500 mg a day.  Limit alcohol intake to no more than 1 drink a day for nonpregnant women and 2 drinks a day for men. One drink equals 12 oz of beer, 5 oz of wine, or 1 oz of hard liquor.  Work with your health care provider to maintain a healthy body weight or to lose weight. Ask what an ideal weight is for you.  Get at least 30 minutes of exercise that causes your heart to beat faster (aerobic exercise) most days of the week. Activities may include walking, swimming, or biking.  Work with your health care provider or diet and nutrition specialist (dietitian) to adjust your eating plan to your individual calorie needs. Reading food labels  Check food labels for the amount of sodium per serving. Choose foods with less than 5 percent of the Daily Value of sodium. Generally, foods with less than 300 mg of sodium per serving fit into this eating plan.  To find whole grains, look for the word "whole" as the first word in the ingredient list. Shopping  Buy products labeled as "low-sodium" or "no salt added."  Buy fresh foods. Avoid canned foods and premade or frozen meals. Cooking  Avoid adding salt when cooking. Use salt-free seasonings or herbs instead of table salt or sea salt. Check with your health care provider or pharmacist before using salt substitutes.  Do not  fry foods. Cook foods using healthy methods such as baking, boiling, grilling, and broiling instead.  Cook with heart-healthy oils, such as olive, canola, soybean, or sunflower oil. Meal planning   Eat a balanced diet that includes: ? 5 or more servings of fruits and vegetables each day. At each meal, try to fill half of your plate with fruits and vegetables. ? Up to 6-8 servings of whole grains each day. ? Less than 6 oz of lean meat, poultry, or fish each day. A 3-oz serving of meat is about the same size as a deck of cards. One egg equals 1 oz. ? 2 servings of low-fat dairy each day. ? A serving of nuts, seeds, or beans 5 times each week. ? Heart-healthy fats. Healthy fats called Omega-3 fatty acids are found in foods such as flaxseeds and coldwater fish, like sardines, salmon, and mackerel.  Limit how much you eat of the following: ? Canned or prepackaged foods. ? Food that is high in trans fat, such as fried foods. ? Food that is high in saturated fat, such as fatty meat. ? Sweets, desserts, sugary drinks, and other foods with added sugar. ? Full-fat dairy products.  Do not salt foods before eating.  Try to eat at least 2 vegetarian meals each week.  Eat more home-cooked food and less restaurant, buffet, and fast food.  When eating at a restaurant, ask that your food be prepared with less  salt or no salt, if possible. What foods are recommended? The items listed may not be a complete list. Talk with your dietitian about what dietary choices are best for you. Grains Whole-grain or whole-wheat bread. Whole-grain or whole-wheat pasta. Brown rice. Modena Morrow. Bulgur. Whole-grain and low-sodium cereals. Pita bread. Low-fat, low-sodium crackers. Whole-wheat flour tortillas. Vegetables Fresh or frozen vegetables (raw, steamed, roasted, or grilled). Low-sodium or reduced-sodium tomato and vegetable juice. Low-sodium or reduced-sodium tomato sauce and tomato paste. Low-sodium or  reduced-sodium canned vegetables. Fruits All fresh, dried, or frozen fruit. Canned fruit in natural juice (without added sugar). Meat and other protein foods Skinless chicken or [place]. Ground chicken or [place]. Pork with fat trimmed off. Fish and seafood. Egg whites. Dried beans, peas, or lentils. Unsalted nuts, nut butters, and seeds. Unsalted canned beans. Lean cuts of beef with fat trimmed off. Low-sodium, lean deli meat. Dairy Low-fat (1%) or fat-free (skim) milk. Fat-free, low-fat, or reduced-fat cheeses. Nonfat, low-sodium ricotta or cottage cheese. Low-fat or nonfat yogurt. Low-fat, low-sodium cheese. Fats and oils Soft margarine without trans fats. Vegetable oil. Low-fat, reduced-fat, or light mayonnaise and salad dressings (reduced-sodium). Canola, safflower, olive, soybean, and sunflower oils. Avocado. Seasoning and other foods Herbs. Spices. Seasoning mixes without salt. Unsalted popcorn and pretzels. Fat-free sweets. What foods are not recommended? The items listed may not be a complete list. Talk with your dietitian about what dietary choices are best for you. Grains Baked goods made with fat, such as croissants, muffins, or some breads. Dry pasta or rice meal packs. Vegetables Creamed or fried vegetables. Vegetables in a cheese sauce. Regular canned vegetables (not low-sodium or reduced-sodium). Regular canned tomato sauce and paste (not low-sodium or reduced-sodium). Regular tomato and vegetable juice (not low-sodium or reduced-sodium). Angie Fava. Olives. Fruits Canned fruit in a light or heavy syrup. Fried fruit. Fruit in cream or butter sauce. Meat and other protein foods Fatty cuts of meat. Ribs. Fried meat. Berniece Salines. Sausage. Bologna and other processed lunch meats. Salami. Fatback. Hotdogs. Bratwurst. Salted nuts and seeds. Canned beans with added salt. Canned or smoked fish. Whole eggs or egg yolks. Chicken or [place] with skin. Dairy Whole or 2% milk, cream, and half-and-half.  Whole or full-fat cream cheese. Whole-fat or sweetened yogurt. Full-fat cheese. Nondairy creamers. Whipped toppings. Processed cheese and cheese spreads. Fats and oils Butter. Stick margarine. Lard. Shortening. Ghee. Bacon fat. Tropical oils, such as coconut, palm kernel, or palm oil. Seasoning and other foods Salted popcorn and pretzels. Onion salt, garlic salt, seasoned salt, table salt, and sea salt. Worcestershire sauce. Tartar sauce. Barbecue sauce. Teriyaki sauce. Soy sauce, including reduced-sodium. Steak sauce. Canned and packaged gravies. Fish sauce. Oyster sauce. Cocktail sauce. Horseradish that you find on the shelf. Ketchup. Mustard. Meat flavorings and tenderizers. Bouillon cubes. Hot sauce and Tabasco sauce. Premade or packaged marinades. Premade or packaged taco seasonings. Relishes. Regular salad dressings. Where to find more information:  National Heart, Lung, and [place]: https://wilson-eaton.com/  American Heart Association: www.heart.org Summary  The DASH eating plan is a healthy eating plan that has been shown to reduce high blood pressure (hypertension). It may also reduce your risk for type 2 diabetes, heart disease, and stroke.  With the DASH eating plan, you should limit salt (sodium) intake to 2,300 mg a day. If you have hypertension, you may need to reduce your sodium intake to 1,500 mg a day.  When on the DASH eating plan, aim to eat more fresh fruits and vegetables, whole grains, lean proteins, low-fat  dairy, and heart-healthy fats.  Work with your health care provider or diet and nutrition specialist (dietitian) to adjust your eating plan to your individual calorie needs. This information is not intended to replace advice given to you by your health care provider. Make sure you discuss any questions you have with your health care provider. Document Released: 03/04/2011 Document Revised: 03/08/2016 Document Reviewed: 03/08/2016 Elsevier Interactive Patient Education   [employer].

## 2017-06-21 LAB — COMPREHENSIVE METABOLIC PANEL
ALBUMIN: 3.9 g/dL (ref 3.5–5.2)
ALK PHOS: 75 U/L (ref 39–117)
ALT: 18 U/L (ref 0–35)
AST: 15 U/L (ref 0–37)
BUN: 10 mg/dL (ref 6–23)
CHLORIDE: 105 meq/L (ref 96–112)
CO2: 31 mEq/L (ref 19–32)
Calcium: 9.3 mg/dL (ref 8.4–10.5)
Creatinine, Ser: 0.79 mg/dL (ref 0.40–1.20)
GFR: 78.61 mL/min (ref >60.00)
Glucose, Bld: 101 mg/dL — ABNORMAL HIGH (ref 70–99)
Potassium: 4 mEq/L (ref 3.5–5.1)
SODIUM: 141 meq/L (ref 135–145)
TOTAL PROTEIN: 7 g/dL (ref 6.0–8.3)
Total Bilirubin: 0.3 mg/dL (ref 0.2–1.2)

## 2017-06-21 LAB — LIPID PANEL
CHOLESTEROL: 195 mg/dL (ref 0–200)
HDL: 42.3 mg/dL (ref >39.00)
LDL CALC: 129 mg/dL — AB (ref 0–99)
NONHDL: 152.73
Total CHOL/HDL Ratio: 5
Triglycerides: 120 mg/dL (ref 0.0–149.0)
VLDL: 24 mg/dL (ref 0.0–40.0)

## 2017-06-21 LAB — CBC
HEMATOCRIT: 39.6 % (ref 36.0–46.0)
Hemoglobin: 13.3 g/dL (ref 12.0–15.0)
MCHC: 33.6 g/dL (ref 30.0–36.0)
MCV: 85.2 fl (ref 78.0–100.0)
Platelets: 294 10*3/uL (ref 150.0–400.0)
RBC: 4.65 Mil/uL (ref 3.87–5.11)
RDW: 13 % (ref 11.5–15.5)
WBC: 7.9 10*3/uL (ref 4.0–10.5)

## 2017-06-21 LAB — T4, FREE: Free T4: 0.78 ng/dL (ref 0.60–1.60)

## 2017-06-22 ENCOUNTER — Encounter: Payer: Self-pay | Admitting: *Deleted

## 2017-07-21 ENCOUNTER — Ambulatory Visit: Payer: 59 | Admitting: Internal Medicine

## 2017-07-21 DIAGNOSIS — Z2089 Contact with and (suspected) exposure to other communicable diseases: Secondary | ICD-10-CM

## 2017-10-22 ENCOUNTER — Other Ambulatory Visit: Payer: Self-pay | Admitting: Internal Medicine

## 2017-11-25 DIAGNOSIS — M1712 Unilateral primary osteoarthritis, left knee: Secondary | ICD-10-CM | POA: Diagnosis not present

## 2018-01-14 ENCOUNTER — Other Ambulatory Visit: Payer: Self-pay

## 2018-01-14 ENCOUNTER — Emergency Department
Admission: EM | Admit: 2018-01-14 | Discharge: 2018-01-14 | Disposition: A | Discharge status: Home / Self Care | Payer: 59 | Attending: Emergency Medicine | Admitting: Emergency Medicine

## 2018-01-14 DIAGNOSIS — T310 Burns involving less than 10% of body surface: Secondary | ICD-10-CM | POA: Insufficient documentation

## 2018-01-14 DIAGNOSIS — Y929 Unspecified place or not applicable: Secondary | ICD-10-CM | POA: Insufficient documentation

## 2018-01-14 DIAGNOSIS — T23001A Burn of unspecified degree of right hand, unspecified site, initial encounter: Secondary | ICD-10-CM | POA: Diagnosis present

## 2018-01-14 DIAGNOSIS — Y9389 Activity, other specified: Secondary | ICD-10-CM | POA: Diagnosis not present

## 2018-01-14 DIAGNOSIS — X18XXXA Contact with other hot metals, initial encounter: Secondary | ICD-10-CM | POA: Diagnosis not present

## 2018-01-14 DIAGNOSIS — Z87891 Personal history of nicotine dependence: Secondary | ICD-10-CM | POA: Diagnosis not present

## 2018-01-14 DIAGNOSIS — M79641 Pain in right hand: Secondary | ICD-10-CM | POA: Diagnosis not present

## 2018-01-14 DIAGNOSIS — Y999 Unspecified external cause status: Secondary | ICD-10-CM | POA: Diagnosis not present

## 2018-01-14 DIAGNOSIS — Z79899 Other long term (current) drug therapy: Secondary | ICD-10-CM | POA: Diagnosis not present

## 2018-01-14 DIAGNOSIS — T23291A Burn of second degree of multiple sites of right wrist and hand, initial encounter: Secondary | ICD-10-CM | POA: Diagnosis not present

## 2018-01-14 DIAGNOSIS — T23201A Burn of second degree of right hand, unspecified site, initial encounter: Secondary | ICD-10-CM

## 2018-01-14 MED ORDER — BUPIVACAINE HCL (PF) 0.5 % IJ SOLN
INTRAMUSCULAR | Status: AC
  Filled 2018-01-14: qty 30

## 2018-01-14 MED ORDER — BACITRACIN ZINC 500 UNIT/GM EX OINT
TOPICAL_OINTMENT | Freq: Once | CUTANEOUS | Status: AC
  Administered 2018-01-14: 2 via TOPICAL
  Filled 2018-01-14: qty 1.8

## 2018-01-14 MED ORDER — KETOROLAC TROMETHAMINE 30 MG/ML IJ SOLN
30.0000 mg | Freq: Once | INTRAMUSCULAR | Status: AC
  Administered 2018-01-14: 30 mg via INTRAMUSCULAR
  Filled 2018-01-14: qty 1

## 2018-01-14 MED ORDER — BUPIVACAINE HCL (PF) 0.5 % IJ SOLN
30.0000 mL | Freq: Once | INTRAMUSCULAR | Status: AC
  Administered 2018-01-14: 30 mL

## 2018-01-14 MED ORDER — DOUBLE ANTIBIOTIC 500-10000 UNIT/GM EX OINT: 1.0000 "application " | TOPICAL_OINTMENT | Freq: Two times a day (BID) | CUTANEOUS | 0 refills | Status: DC

## 2018-01-14 MED ORDER — HYDROCODONE-ACETAMINOPHEN 5-325 MG PO TABS: 1.0000 | ORAL_TABLET | Freq: Four times a day (QID) | ORAL | 0 refills | Status: DC | PRN

## 2018-01-14 MED ORDER — IBUPROFEN 600 MG PO TABS: 600.0000 mg | ORAL_TABLET | Freq: Three times a day (TID) | ORAL | 0 refills | Status: DC | PRN
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
## 2018-01-14 NOTE — Discharge Instructions (Signed)
Please follow up monday at the [facility].  Please Call for An Appointment, [phone]  [email or web address]  Change the dressing on your hands twice a day and in between dressings keep your hand clean and dry.  Take your pain medication as needed for severe symptoms and return to the emergency department for any concerns.  It was a pleasure to take care of you today, and thank you for coming to our emergency department.  If you have any questions or concerns before leaving please ask the nurse to grab me and I'm more than happy to go through your aftercare instructions again.  If you were prescribed any opioid pain medication today such as Norco, Vicodin, Percocet, morphine, hydrocodone, or oxycodone please make sure you do not drive when you are taking this medication as it can alter your ability to drive safely.  If you have any concerns once you are home that you are not improving or are in fact getting worse before you can make it to your follow-up appointment, please do not hesitate to call 911 and come back for further evaluation.  Merrily Brittle, MD

## 2019-07-13 ENCOUNTER — Ambulatory Visit: Payer: Self-pay | Attending: Internal Medicine

## 2019-07-13 DIAGNOSIS — Z23 Encounter for immunization: Secondary | ICD-10-CM

## 2019-07-13 NOTE — Progress Notes (Signed)
   Covid-19 Vaccination Clinic  Name:  Sheena Kennedy    MRN: [id number] DOB: [birth year]  07/13/2019  Sheena Kennedy was observed post Covid-19 immunization for 15 minutes without incident. She was provided with Vaccine Information Sheet and instruction to access the V-Safe system.   Sheena Kennedy was instructed to call 911 with any severe reactions post vaccine: Marland Kitchen Difficulty breathing  . Swelling of face and throat  . A fast heartbeat  . A bad rash all over body  . Dizziness and weakness   Immunizations Administered    Name Date Dose VIS Date Route   Pfizer COVID-19 Vaccine 07/13/2019 12:00 PM 0.3 mL 03/09/2019 Intramuscular   Manufacturer: [employer], [employer]   Lot: [id number]   NDC: [address]-3

## 2019-08-07 ENCOUNTER — Ambulatory Visit: Payer: Self-pay | Attending: Internal Medicine

## 2019-08-07 DIAGNOSIS — Z23 Encounter for immunization: Secondary | ICD-10-CM

## 2019-08-07 NOTE — Progress Notes (Signed)
   Covid-19 Vaccination Clinic  Name:  Sheena Kennedy    MRN: [id number] DOB: Dec 05, 1955  08/07/2019  [name] was observed post Covid-19 immunization for 15 minutes without incident. She was provided with Vaccine Information Sheet and instruction to access the V-Safe system.   [name] was instructed to call 911 with any severe reactions post vaccine: Marland Kitchen Difficulty breathing  . Swelling of face and throat  . A fast heartbeat  . A bad rash all over body  . Dizziness and weakness   Immunizations Administered    Name Date Dose VIS Date Route   Pfizer COVID-19 Vaccine 08/07/2019  4:41 PM 0.3 mL 05/23/2018 Intramuscular   Manufacturer: [employer], [employer]   Lot: [id number]   NDC: [address]-0

## 2020-03-18 DIAGNOSIS — Z20822 Contact with and (suspected) exposure to covid-19: Secondary | ICD-10-CM | POA: Diagnosis not present

## 2020-03-18 DIAGNOSIS — Z03818 Encounter for observation for suspected exposure to other biological agents ruled out: Secondary | ICD-10-CM | POA: Diagnosis not present

## 2020-03-18 DIAGNOSIS — U071 COVID-19: Secondary | ICD-10-CM | POA: Diagnosis not present

## 2020-04-16 ENCOUNTER — Other Ambulatory Visit: Payer: Self-pay | Admitting: Internal Medicine

## 2020-04-16 DIAGNOSIS — Z1231 Encounter for screening mammogram for malignant neoplasm of breast: Secondary | ICD-10-CM

## 2020-06-09 DIAGNOSIS — C44311 Basal cell carcinoma of skin of nose: Secondary | ICD-10-CM | POA: Diagnosis not present

## 2020-06-09 DIAGNOSIS — L718 Other rosacea: Secondary | ICD-10-CM | POA: Diagnosis not present

## 2020-06-09 DIAGNOSIS — D485 Neoplasm of uncertain behavior of skin: Secondary | ICD-10-CM | POA: Diagnosis not present

## 2020-06-09 DIAGNOSIS — L720 Epidermal cyst: Secondary | ICD-10-CM | POA: Diagnosis not present

## 2020-06-09 DIAGNOSIS — L821 Other seborrheic keratosis: Secondary | ICD-10-CM | POA: Diagnosis not present

## 2020-06-23 DIAGNOSIS — H524 Presbyopia: Secondary | ICD-10-CM | POA: Diagnosis not present

## 2020-06-23 DIAGNOSIS — H2513 Age-related nuclear cataract, bilateral: Secondary | ICD-10-CM | POA: Diagnosis not present

## 2020-06-23 DIAGNOSIS — H25013 Cortical age-related cataract, bilateral: Secondary | ICD-10-CM | POA: Diagnosis not present

## 2020-06-23 DIAGNOSIS — H11002 Unspecified pterygium of left eye: Secondary | ICD-10-CM | POA: Diagnosis not present

## 2020-06-23 DIAGNOSIS — H52213 Irregular astigmatism, bilateral: Secondary | ICD-10-CM | POA: Diagnosis not present

## 2020-06-23 DIAGNOSIS — H2512 Age-related nuclear cataract, left eye: Secondary | ICD-10-CM | POA: Diagnosis not present

## 2020-06-23 DIAGNOSIS — H04123 Dry eye syndrome of bilateral lacrimal glands: Secondary | ICD-10-CM | POA: Diagnosis not present

## 2020-08-05 DIAGNOSIS — H2512 Age-related nuclear cataract, left eye: Secondary | ICD-10-CM | POA: Diagnosis not present

## 2020-08-05 DIAGNOSIS — H25812 Combined forms of age-related cataract, left eye: Secondary | ICD-10-CM | POA: Diagnosis not present

## 2020-08-12 DIAGNOSIS — H2512 Age-related nuclear cataract, left eye: Secondary | ICD-10-CM | POA: Diagnosis not present

## 2020-09-01 DIAGNOSIS — H2511 Age-related nuclear cataract, right eye: Secondary | ICD-10-CM | POA: Diagnosis not present

## 2020-09-01 DIAGNOSIS — H25011 Cortical age-related cataract, right eye: Secondary | ICD-10-CM | POA: Diagnosis not present

## 2020-10-06 DIAGNOSIS — L988 Other specified disorders of the skin and subcutaneous tissue: Secondary | ICD-10-CM | POA: Diagnosis not present

## 2020-10-06 DIAGNOSIS — C44311 Basal cell carcinoma of skin of nose: Secondary | ICD-10-CM | POA: Diagnosis not present

## 2020-10-06 DIAGNOSIS — L578 Other skin changes due to chronic exposure to nonionizing radiation: Secondary | ICD-10-CM | POA: Diagnosis not present

## 2020-10-06 DIAGNOSIS — L814 Other melanin hyperpigmentation: Secondary | ICD-10-CM | POA: Diagnosis not present

## 2020-10-30 DIAGNOSIS — F321 Major depressive disorder, single episode, moderate: Secondary | ICD-10-CM | POA: Diagnosis not present

## 2020-10-30 DIAGNOSIS — F411 Generalized anxiety disorder: Secondary | ICD-10-CM | POA: Diagnosis not present

## 2020-11-10 DIAGNOSIS — D2271 Melanocytic nevi of right lower limb, including hip: Secondary | ICD-10-CM | POA: Diagnosis not present

## 2020-11-10 DIAGNOSIS — Z85828 Personal history of other malignant neoplasm of skin: Secondary | ICD-10-CM | POA: Diagnosis not present

## 2020-11-10 DIAGNOSIS — D225 Melanocytic nevi of trunk: Secondary | ICD-10-CM | POA: Diagnosis not present

## 2020-11-10 DIAGNOSIS — D2272 Melanocytic nevi of left lower limb, including hip: Secondary | ICD-10-CM | POA: Diagnosis not present

## 2020-11-13 DIAGNOSIS — F411 Generalized anxiety disorder: Secondary | ICD-10-CM | POA: Diagnosis not present

## 2020-11-13 DIAGNOSIS — F321 Major depressive disorder, single episode, moderate: Secondary | ICD-10-CM | POA: Diagnosis not present

## 2020-11-18 DIAGNOSIS — H2511 Age-related nuclear cataract, right eye: Secondary | ICD-10-CM | POA: Diagnosis not present

## 2020-11-18 DIAGNOSIS — H25811 Combined forms of age-related cataract, right eye: Secondary | ICD-10-CM | POA: Diagnosis not present

## 2020-11-26 DIAGNOSIS — F321 Major depressive disorder, single episode, moderate: Secondary | ICD-10-CM | POA: Diagnosis not present

## 2020-11-26 DIAGNOSIS — F411 Generalized anxiety disorder: Secondary | ICD-10-CM | POA: Diagnosis not present

## 2020-11-26 DIAGNOSIS — H2511 Age-related nuclear cataract, right eye: Secondary | ICD-10-CM | POA: Diagnosis not present

## 2020-12-10 DIAGNOSIS — F321 Major depressive disorder, single episode, moderate: Secondary | ICD-10-CM | POA: Diagnosis not present

## 2020-12-10 DIAGNOSIS — F411 Generalized anxiety disorder: Secondary | ICD-10-CM | POA: Diagnosis not present

## 2020-12-31 DIAGNOSIS — F411 Generalized anxiety disorder: Secondary | ICD-10-CM | POA: Diagnosis not present

## 2020-12-31 DIAGNOSIS — F321 Major depressive disorder, single episode, moderate: Secondary | ICD-10-CM | POA: Diagnosis not present

## 2021-01-14 DIAGNOSIS — F321 Major depressive disorder, single episode, moderate: Secondary | ICD-10-CM | POA: Diagnosis not present

## 2021-01-14 DIAGNOSIS — F411 Generalized anxiety disorder: Secondary | ICD-10-CM | POA: Diagnosis not present

## 2021-02-04 DIAGNOSIS — F321 Major depressive disorder, single episode, moderate: Secondary | ICD-10-CM | POA: Diagnosis not present

## 2021-02-04 DIAGNOSIS — F411 Generalized anxiety disorder: Secondary | ICD-10-CM | POA: Diagnosis not present

## 2021-03-12 DIAGNOSIS — H02889 Meibomian gland dysfunction of unspecified eye, unspecified eyelid: Secondary | ICD-10-CM | POA: Diagnosis not present

## 2021-03-12 DIAGNOSIS — H04129 Dry eye syndrome of unspecified lacrimal gland: Secondary | ICD-10-CM | POA: Diagnosis not present

## 2021-03-13 DIAGNOSIS — F321 Major depressive disorder, single episode, moderate: Secondary | ICD-10-CM | POA: Diagnosis not present

## 2021-03-13 DIAGNOSIS — F411 Generalized anxiety disorder: Secondary | ICD-10-CM | POA: Diagnosis not present

## 2021-04-08 DIAGNOSIS — F411 Generalized anxiety disorder: Secondary | ICD-10-CM | POA: Diagnosis not present

## 2021-04-08 DIAGNOSIS — F321 Major depressive disorder, single episode, moderate: Secondary | ICD-10-CM | POA: Diagnosis not present

## 2021-10-19 ENCOUNTER — Ambulatory Visit: Payer: BC Managed Care – PPO | Admitting: Family

## 2021-10-19 ENCOUNTER — Ambulatory Visit (INDEPENDENT_AMBULATORY_CARE_PROVIDER_SITE_OTHER)
Admission: RE | Admit: 2021-10-19 | Discharge: 2021-10-19 | Disposition: A | Discharge status: Home / Self Care | Payer: BC Managed Care – PPO | Source: Ambulatory Visit | Attending: Family | Admitting: Family

## 2021-10-19 ENCOUNTER — Encounter: Payer: Self-pay | Admitting: Family

## 2021-10-19 VITALS — BP 132/70 | HR 67 | Temp 98.2°F | Resp 16 | Ht 62.5 in | Wt 195.3 lb

## 2021-10-19 DIAGNOSIS — R41840 Attention and concentration deficit: Secondary | ICD-10-CM

## 2021-10-19 DIAGNOSIS — S6991XA Unspecified injury of right wrist, hand and finger(s), initial encounter: Secondary | ICD-10-CM

## 2021-10-19 DIAGNOSIS — F320 Major depressive disorder, single episode, mild: Secondary | ICD-10-CM

## 2021-10-19 DIAGNOSIS — R5383 Other fatigue: Secondary | ICD-10-CM | POA: Diagnosis not present

## 2021-10-19 DIAGNOSIS — R739 Hyperglycemia, unspecified: Secondary | ICD-10-CM

## 2021-10-19 DIAGNOSIS — Z1231 Encounter for screening mammogram for malignant neoplasm of breast: Secondary | ICD-10-CM

## 2021-10-19 DIAGNOSIS — R7989 Other specified abnormal findings of blood chemistry: Secondary | ICD-10-CM

## 2021-10-19 DIAGNOSIS — E785 Hyperlipidemia, unspecified: Secondary | ICD-10-CM | POA: Diagnosis not present

## 2021-10-19 DIAGNOSIS — E0590 Thyrotoxicosis, unspecified without thyrotoxic crisis or storm: Secondary | ICD-10-CM | POA: Insufficient documentation

## 2021-10-19 DIAGNOSIS — K219 Gastro-esophageal reflux disease without esophagitis: Secondary | ICD-10-CM | POA: Diagnosis not present

## 2021-10-19 MED ORDER — FLUOXETINE HCL 20 MG PO TABS: 20.0000 mg | ORAL_TABLET | Freq: Every day | ORAL | 3 refills | Status: DC

## 2021-10-19 MED ORDER — PANTOPRAZOLE SODIUM 20 MG PO TBEC: 20.0000 mg | DELAYED_RELEASE_TABLET | Freq: Every day | ORAL | 0 refills | Status: DC

## 2021-10-19 NOTE — Assessment & Plan Note (Signed)
Ordering free t3, free t4 and tsh pending results

## 2021-10-19 NOTE — Assessment & Plan Note (Signed)
Trial start prozac  I instructed pt to start prozac 1/2 tablet once daily for 1 week and then increase to a full tablet once daily on week two as tolerated.  We discussed common side effects such as nausea, drowsiness and weight gain.  Also discussed rare but serious side effect of suicidal ideation.  She is instructed to discontinue medication and go directly to ED if this occurs.  Pt verbalizes understanding.  Plan is to follow up in 30 days to evaluate progress.

## 2021-10-19 NOTE — Assessment & Plan Note (Signed)
Ordering a1c work on diabetic diet exercise as tolerated

## 2021-10-19 NOTE — Assessment & Plan Note (Signed)
Repeat thyroid levels pending results

## 2021-10-19 NOTE — Assessment & Plan Note (Signed)
Mammogram ordered. Pending results. 

## 2021-10-19 NOTE — Assessment & Plan Note (Signed)
Fatigue workup with labs pending results 

## 2021-10-19 NOTE — Assessment & Plan Note (Signed)
Start protonix 20 mg once daily  Try to decrease and or avoid spicy foods, fried fatty foods, and also caffeine and chocolate as these can increase heartburn symptoms.

## 2021-10-19 NOTE — Patient Instructions (Addendum)
Welcome to our clinic, I am happy to have you as my new patient. I am excited to continue on this healthcare journey with you.  Call [facility] to schedule your mammogram as I have sent the electronic order to their facility.  Here is their number : [phone]   Start prozac 20 mg for anxiety and depression. Take 1/2 tablet by mouth once daily for about one week, then increase to 1 full tablet thereafter.   Taking the medicine as directed and not missing any doses is one of the best things you can do to treat your depression.  Here are some things to keep in mind:  Side effects (stomach upset, some increased anxiety) may happen before you notice a benefit.  These side effects typically go away over time. Changes to your dose of medicine or a change in medication all together is sometimes necessary Many people will notice an improvement within two weeks but the full effect of the medication can take up to 4-6 weeks Stopping the medication when you start feeling better often results in a return of symptoms. Most people need to be on medication at least 6-12 months If you start having thoughts of hurting yourself or others after starting this medicine, please call me immediately.    Please start trial of pantoprazole as prescribed. Take this medication for two weeks, administer 30 minutes prior to breakfast each am. Try to decrease and or avoid spicy foods, fried fatty foods, and also caffeine and chocolate as these can increase heartburn symptoms.   Stop by the lab prior to leaving today. I will notify you of your results once received.   Please keep in mind Any my chart messages you send have up to a three business day turnaround for a response.  Phone calls may take up to a one full business day turnaround for a  response.   If you need a medication refill I recommend you request it through the pharmacy as this is easiest for [place] rather than sending a message and  or phone call.   Due to recent changes in healthcare laws, you may see results of your imaging and/or laboratory studies on MyChart before I have had a chance to review them.  I understand that in some cases there may be results that are confusing or concerning to you. Please understand that not all results are received at the same time and often I may need to interpret multiple results in order to provide you with the best plan of care or course of treatment. Therefore, I ask that you please give me 2 business days to thoroughly review all your results before contacting my office for clarification. Should we see a critical lab result, you will be contacted sooner.   It was a pleasure seeing you today! Please do not hesitate to reach out with any questions and or concerns.  Regards,   Mort Sawyers FNP-C

## 2021-10-19 NOTE — Progress Notes (Signed)
New Patient Office Visit  Subjective:  Patient ID: Sheena Kennedy, female    DOB: [birth year]  Age: 66 y.o. MRN: [id number]  CC:  Chief Complaint  Patient presents with   Establish Care    HPI Sheena Kennedy is here to establish care as a new patient.  Prior provider was: Dr. Alphonsus Sias  Pt is without acute concerns.   Had custody of two grandchildren, 8 y/o has moved out into society. Mom (her daughter) was a drug addict. Also has custody of her grand daugther age 52 who has been getting into a lot of trouble. Marland Kitchen  GERD: heartburn, takes tum at night.   Increased fatigue, lack of energy.snores at times at night but doesn't wake up gasping at night.    Hyperlipidemia:  Lab Results  Component Value Date   CHOL 195 06/20/2017   HDL 42.30 06/20/2017   LDLCALC 129 (H) 06/20/2017   LDLDIRECT 166.3 07/12/2011   TRIG 120.0 06/20/2017   CHOLHDL 5 06/20/2017   Right hand injury on the boat about over the weekend. Has been painful and is bruised  MDD: has tried prozac in the past. Is seeing a therapist for this which has been helping. Hard to concentrate feels like she is working in circles at work. She often wonders if she might have ADD.     10/19/2021    2:39 PM  PHQ9 SCORE ONLY  PHQ-9 Total Score 10    Past Medical History:  Diagnosis Date   Chest pain    ETT-myoview (8/10): 7'30", 84% MPHR, stopped due to fatigue with no CP or ECG changes, EF 60%, no evidence for ischemia or infarction on perfusion images   Hyperlipidemia    MDD (major depressive disorder), single episode, mild (HCC)     Past Surgical History:  Procedure Laterality Date   BONE TUMOR RESECTION  1980's   Benign in right leg   CARPAL TUNNEL RELEASE     Right   CYSTECTOMY     Right hand   NERVE REPAIR     Right forearm   VAGINAL HYSTERECTOMY  1992   Fibroids    Family History  Problem Relation Age of Onset   Fibromyalgia Mother    Colitis Mother    Congestive Heart Failure Father    Kidney  disease Father    Stomach cancer Brother    Diabetes Brother    Hypertension Brother    Obesity Brother    Stroke Brother    Parkinson's disease Brother    Dementia Maternal Grandmother    Diabetes Paternal Grandmother    Heart attack Paternal Grandfather    Bipolar disorder Daughter    Drug abuse Daughter    Breast cancer Paternal Aunt    Hypertension Other        Multiple family members   Cancer Other        Colon, distant on dad's side   Depression Other        Strong hx   Coronary artery disease Neg Hx     Social History   Socioeconomic History   Marital status: Divorced    Spouse name: Not on file   Number of children: 2   Years of education: Not on file   Highest education level: Not on file  Occupational History   Occupation: Chief Operating Officer at [employer]    Employer: [employer]  Tobacco Use   Smoking status: Former    Packs/day: 1.00    Years:  15.00    Total pack years: 15.00    Types: Cigarettes    Quit date: 11/27/2011    Years since quitting: 9.9   Smokeless tobacco: Never   Tobacco comments:    quit 11/27/11  Vaping Use   Vaping Use: Never used  Substance and Sexual Activity   Alcohol use: Yes    Comment: Rare   Drug use: No   Sexual activity: Yes    Partners: Male    Birth control/protection: None, Post-menopausal  Other Topics Concern   Not on file  Social History Narrative   Lives in [place] with one grand child daughter, age 28 (she has custody).   Older grand son has moved out , age 10.    Daughter (mother of children was a drug addict)   Boyfriend- lives next door   Social Determinants of Health   Financial Resource Strain: Not on file  Food Insecurity: Not on file  Transportation Needs: Not on file  Physical Activity: Not on file  Stress: Not on file  Social Connections: Not on file  Intimate Partner Violence: Not on file    Outpatient Medications Prior to Visit  Medication Sig Dispense Refill   FLUoxetine (PROZAC) 20 MG  tablet TAKE 1 TABLET BY MOUTH EVERY DAY (Patient not taking: Reported on 10/19/2021) 90 tablet 2   HYDROcodone-acetaminophen (NORCO) 5-325 MG tablet Take 1 tablet by mouth every 6 (six) hours as needed for up to 15 doses for severe pain. (Patient not taking: Reported on 10/19/2021) 15 tablet 0   ibuprofen (ADVIL,MOTRIN) 600 MG tablet Take 1 tablet (600 mg total) by mouth every 8 (eight) hours as needed. (Patient not taking: Reported on 10/19/2021) 30 tablet 0   polymixin-bacitracin (POLYSPORIN) 500-10000 UNIT/GM OINT ointment Apply 1 application topically 2 (two) times daily. (Patient not taking: Reported on 10/19/2021) 1 Tube 0   No facility-administered medications prior to visit.    No Known Allergies      Objective:    Physical Exam Vitals reviewed.  Constitutional:      General: She is not in acute distress.    Appearance: Normal appearance. She is obese. She is not ill-appearing, toxic-appearing or diaphoretic.  HENT:     Right Ear: Tympanic membrane normal.     Left Ear: Tympanic membrane normal.     Mouth/Throat:     Mouth: Mucous membranes are moist.     Pharynx: No pharyngeal swelling.     Tonsils: No tonsillar exudate.  Eyes:     Extraocular Movements: Extraocular movements intact.     Conjunctiva/sclera: Conjunctivae normal.     Pupils: Pupils are equal, round, and reactive to light.  Neck:     Thyroid: No thyroid mass.  Cardiovascular:     Rate and Rhythm: Normal rate and regular rhythm.  Pulmonary:     Effort: Pulmonary effort is normal.     Breath sounds: Normal breath sounds.  Abdominal:     General: Abdomen is flat. Bowel sounds are normal.     Palpations: Abdomen is soft.  Musculoskeletal:        General: Normal range of motion.     Right hand: Swelling (anterior hand) and tenderness (point tenderness right anterior hand) present. Normal range of motion (painful grip). Decreased strength (slight).  Lymphadenopathy:     Cervical:     Right cervical: No  superficial cervical adenopathy.    Left cervical: No superficial cervical adenopathy.  Skin:    General: Skin is warm.  Capillary Refill: Capillary refill takes less than 2 seconds.  Neurological:     General: No focal deficit present.     Mental Status: She is alert and oriented to person, place, and time.  Psychiatric:        Mood and Affect: Mood normal.        Behavior: Behavior normal.        Thought Content: Thought content normal.        Judgment: Judgment normal.       BP 132/70   Pulse 67   Temp 98.2 F (36.8 C)   Resp 16   Ht 5' 2.5" (1.588 m)   Wt 195 lb 5 oz (88.6 kg)   SpO2 98%   BMI 35.15 kg/m  Wt Readings from Last 3 Encounters:  10/19/21 195 lb 5 oz (88.6 kg)  01/14/18 190 lb (86.2 kg)  06/20/17 194 lb (88 kg)     Health Maintenance Due  Topic Date Due   HIV Screening  Never done   COLONOSCOPY (Pts 45-47yrs Insurance coverage will need to be confirmed)  Never done   Zoster Vaccines- Shingrix (1 of 2) Never done   MAMMOGRAM  11/20/2016   COVID-19 Vaccine (3 - Pfizer series) 10/02/2019   Pneumonia Vaccine 84+ Years old (1 - PCV) Never done   DEXA SCAN  Never done   TETANUS/TDAP  07/11/2021    There are no preventive care reminders to display for this patient.  Lab Results  Component Value Date   TSH 0.04 (L) 06/11/2013   Lab Results  Component Value Date   WBC 7.9 06/20/2017   HGB 13.3 06/20/2017   HCT 39.6 06/20/2017   MCV 85.2 06/20/2017   PLT 294.0 06/20/2017   Lab Results  Component Value Date   NA 141 06/20/2017   K 4.0 06/20/2017   CO2 31 06/20/2017   GLUCOSE 101 (H) 06/20/2017   BUN 10 06/20/2017   CREATININE 0.79 06/20/2017   BILITOT 0.3 06/20/2017   ALKPHOS 75 06/20/2017   AST 15 06/20/2017   ALT 18 06/20/2017   PROT 7.0 06/20/2017   ALBUMIN 3.9 06/20/2017   CALCIUM 9.3 06/20/2017   GFR 78.61 06/20/2017   Lab Results  Component Value Date   CHOL 195 06/20/2017   Lab Results  Component Value Date   HDL 42.30  06/20/2017   Lab Results  Component Value Date   LDLCALC 129 (H) 06/20/2017   Lab Results  Component Value Date   TRIG 120.0 06/20/2017   Lab Results  Component Value Date   CHOLHDL 5 06/20/2017   No results found for: "HGBA1C"    Assessment & Plan:   Problem List Items Addressed This Visit       Digestive   GERD (gastroesophageal reflux disease)    Start protonix 20 mg once daily  Try to decrease and or avoid spicy foods, fried fatty foods, and also caffeine and chocolate as these can increase heartburn symptoms.        Relevant Medications   pantoprazole (PROTONIX) 20 MG tablet     Endocrine   RESOLVED: Hyperthyroidism    Ordering free t3, free t4 and tsh pending results        Other   Hyperlipemia    Lipid panel ordered pending results.        Relevant Orders   Lipid panel   MDD (major depressive disorder), single episode, mild (HCC)    Trial start prozac  I instructed pt to  start prozac 1/2 tablet once daily for 1 week and then increase to a full tablet once daily on week two as tolerated.  We discussed common side effects such as nausea, drowsiness and weight gain.  Also discussed rare but serious side effect of suicidal ideation.  She is instructed to discontinue medication and go directly to ED if this occurs.  Pt verbalizes understanding.  Plan is to follow up in 30 days to evaluate progress.          Relevant Medications   FLUoxetine (PROZAC) 20 MG tablet   Hyperglycemia - Primary    Ordering a1c work on diabetic diet exercise as tolerated      Relevant Orders   Hemoglobin A1c   Abnormal thyroid blood test    Repeat thyroid levels pending results      Relevant Orders   T3, free   T4, free   TSH   Other fatigue    Fatigue work up with labs pending results      Relevant Orders   CBC   Comprehensive metabolic panel   T3, free   T4, free   TSH   B12 and Folate Panel   Lack of concentration    Will treat MDD, if no improvement with  lack of focus will consider workup with psychiatry for add      Hand injury, right, initial encounter    Xray today pending results Use ace wrap for stability  nsaids prn Ice prn      Relevant Orders   DG Hand Complete Right   Encounter for screening mammogram for malignant neoplasm of breast    Mammogram ordered. Pending results.       Relevant Orders   MM 3D SCREEN BREAST BILATERAL    Meds ordered this encounter  Medications   pantoprazole (PROTONIX) 20 MG tablet    Sig: Take 1 tablet (20 mg total) by mouth daily.    Dispense:  30 tablet    Refill:  0    Order Specific Question:   Supervising Provider    Answer:   BEDSOLE, AMY E [[id number]]   FLUoxetine (PROZAC) 20 MG tablet    Sig: Take 1 tablet (20 mg total) by mouth daily.    Dispense:  90 tablet    Refill:  3    Order Specific Question:   Supervising Provider    Answer:   Ermalene Searing, AMY E [[id number]]    Follow-up: Return in about 6 months (around 04/21/2022) for one month follow up .    Mort Sawyers, FNP

## 2021-10-19 NOTE — Assessment & Plan Note (Signed)
Lipid panel ordered pending results.   

## 2021-10-19 NOTE — Assessment & Plan Note (Signed)
Xray today pending results Use ace wrap for stability  nsaids prn Ice prn

## 2021-10-19 NOTE — Assessment & Plan Note (Signed)
Will treat MDD, if no improvement with lack of focus will consider workup with psychiatry for add

## 2021-10-20 LAB — COMPREHENSIVE METABOLIC PANEL
ALT: 19 U/L (ref 0–35)
AST: 15 U/L (ref 0–37)
Albumin: 4.2 g/dL (ref 3.5–5.2)
Alkaline Phosphatase: 73 U/L (ref 39–117)
BUN: 9 mg/dL (ref 6–23)
CO2: 30 mEq/L (ref 19–32)
Calcium: 9.3 mg/dL (ref 8.4–10.5)
Chloride: 104 mEq/L (ref 96–112)
Creatinine, Ser: 0.87 mg/dL (ref 0.40–1.20)
GFR: 69.91 mL/min (ref >60.00)
Glucose, Bld: 86 mg/dL (ref 70–99)
Potassium: 3.9 mEq/L (ref 3.5–5.1)
Sodium: 141 mEq/L (ref 135–145)
Total Bilirubin: 0.4 mg/dL (ref 0.2–1.2)
Total Protein: 6.5 g/dL (ref 6.0–8.3)

## 2021-10-20 LAB — LIPID PANEL
Cholesterol: 213 mg/dL — ABNORMAL HIGH (ref 0–200)
HDL: 53.1 mg/dL (ref >39.00)
LDL Cholesterol: 140 mg/dL — ABNORMAL HIGH (ref 0–99)
NonHDL: 159.93
Total CHOL/HDL Ratio: 4
Triglycerides: 100 mg/dL (ref 0.0–149.0)
VLDL: 20 mg/dL (ref 0.0–40.0)

## 2021-10-20 LAB — HEMOGLOBIN A1C: Hgb A1c MFr Bld: 5.8 % (ref 4.6–6.5)

## 2021-10-20 LAB — CBC
HCT: 39.6 % (ref 36.0–46.0)
Hemoglobin: 13.4 g/dL (ref 12.0–15.0)
MCHC: 33.8 g/dL (ref 30.0–36.0)
MCV: 85.5 fl (ref 78.0–100.0)
Platelets: 252 10*3/uL (ref 150.0–400.0)
RBC: 4.63 Mil/uL (ref 3.87–5.11)
RDW: 12.7 % (ref 11.5–15.5)
WBC: 7 10*3/uL (ref 4.0–10.5)

## 2021-10-20 LAB — T3, FREE: T3, Free: 3.3 pg/mL (ref 2.3–4.2)

## 2021-10-20 LAB — T4, FREE: Free T4: 0.76 ng/dL (ref 0.60–1.60)

## 2021-10-20 LAB — TSH: TSH: 1.08 u[IU]/mL (ref 0.35–5.50)

## 2021-10-20 LAB — B12 AND FOLATE PANEL
Folate: 8.8 ng/mL (ref >5.9)
Vitamin B-12: 419 pg/mL (ref 211–911)

## 2021-10-20 NOTE — Progress Notes (Signed)
Thyroid has improved since last visit, now at 1.08  B12 on lower end of normal recommend otc 500 mcg once daily b12  Chol elevated was pt fasting? If fasting we really should work on low chol diet and work on exercise x three months if still elevated with 3 month f/u and repeat consider medication   Pt also prediabetic work on diabetic diet  If not already, schedule for three month f/u and come FASTING for labs

## 2021-10-21 ENCOUNTER — Telehealth: Payer: Self-pay | Admitting: Family

## 2021-10-21 NOTE — Telephone Encounter (Signed)
Pt has a 3 month follow up. Do you need to see her in 1 month since you started her on a new medication or can it wait until her 3 month visit?

## 2021-10-21 NOTE — Telephone Encounter (Signed)
Patient called back about message below:   Vertis Kelch, [facility]  10/21/2021  3:31 PM EDT Back to Top    Left message to return call to our office.    Mort Sawyers, FNP  10/20/2021  4:39 PM EDT     Thyroid has improved since last visit, now at 1.08  B12 on lower end of normal recommend otc 500 mcg once daily b12  Chol elevated was pt fasting? If fasting we really should work on low chol diet and work on exercise x three months if still elevated with 3 month f/u and repeat consider medication    Pt also prediabetic work on diabetic diet   If not already, schedule for three month f/u and come FASTING for labs   I made her aware of Tabitha's message, she said she wasn't fasting and that she had ate and had coffee with creamer before hand. She also mentioned that [place] had put her on prozac and told her she would follow up in a month, Is that just going to be a call or does she need to be seen? I scheduled her for the 3 month follow up

## 2021-10-22 NOTE — Telephone Encounter (Signed)
Sorry for the confusion I must have accidentally clicked six month f/u  Pease schedule for one month f/u since new start of med

## 2021-10-23 NOTE — Telephone Encounter (Signed)
Pt need a 1 month follow up on new med.

## 2021-11-04 DIAGNOSIS — F411 Generalized anxiety disorder: Secondary | ICD-10-CM | POA: Diagnosis not present

## 2021-11-04 DIAGNOSIS — F321 Major depressive disorder, single episode, moderate: Secondary | ICD-10-CM | POA: Diagnosis not present

## 2021-11-10 DIAGNOSIS — L718 Other rosacea: Secondary | ICD-10-CM | POA: Diagnosis not present

## 2021-11-10 DIAGNOSIS — D2262 Melanocytic nevi of left upper limb, including shoulder: Secondary | ICD-10-CM | POA: Diagnosis not present

## 2021-11-10 DIAGNOSIS — D225 Melanocytic nevi of trunk: Secondary | ICD-10-CM | POA: Diagnosis not present

## 2021-11-10 DIAGNOSIS — Z85828 Personal history of other malignant neoplasm of skin: Secondary | ICD-10-CM | POA: Diagnosis not present

## 2021-11-10 NOTE — Telephone Encounter (Signed)
Pt has an appt for 8/28

## 2021-11-11 ENCOUNTER — Ambulatory Visit
Admission: RE | Admit: 2021-11-11 | Discharge: 2021-11-11 | Disposition: A | Discharge status: Home / Self Care | Payer: BC Managed Care – PPO | Source: Ambulatory Visit | Attending: Family | Admitting: Family

## 2021-11-11 DIAGNOSIS — Z1231 Encounter for screening mammogram for malignant neoplasm of breast: Secondary | ICD-10-CM | POA: Diagnosis not present

## 2021-11-13 ENCOUNTER — Other Ambulatory Visit: Payer: Self-pay | Admitting: Family

## 2021-11-13 DIAGNOSIS — N6489 Other specified disorders of breast: Secondary | ICD-10-CM

## 2021-11-13 DIAGNOSIS — R928 Other abnormal and inconclusive findings on diagnostic imaging of breast: Secondary | ICD-10-CM

## 2021-11-18 ENCOUNTER — Ambulatory Visit
Admission: RE | Admit: 2021-11-18 | Discharge: 2021-11-18 | Disposition: A | Discharge status: Home / Self Care | Payer: BC Managed Care – PPO | Source: Ambulatory Visit | Attending: Family | Admitting: Family

## 2021-11-18 DIAGNOSIS — N6489 Other specified disorders of breast: Secondary | ICD-10-CM | POA: Diagnosis not present

## 2021-11-18 DIAGNOSIS — N6002 Solitary cyst of left breast: Secondary | ICD-10-CM | POA: Diagnosis not present

## 2021-11-18 DIAGNOSIS — R928 Other abnormal and inconclusive findings on diagnostic imaging of breast: Secondary | ICD-10-CM | POA: Insufficient documentation

## 2021-11-18 DIAGNOSIS — R922 Inconclusive mammogram: Secondary | ICD-10-CM | POA: Diagnosis not present

## 2021-11-23 ENCOUNTER — Encounter: Payer: Self-pay | Admitting: Family

## 2021-11-23 ENCOUNTER — Ambulatory Visit: Payer: BC Managed Care – PPO | Admitting: Family

## 2021-11-23 VITALS — BP 124/78 | HR 62 | Temp 98.6°F | Resp 16 | Ht 62.5 in | Wt 191.0 lb

## 2021-11-23 DIAGNOSIS — F320 Major depressive disorder, single episode, mild: Secondary | ICD-10-CM

## 2021-11-23 DIAGNOSIS — E785 Hyperlipidemia, unspecified: Secondary | ICD-10-CM

## 2021-11-23 DIAGNOSIS — R739 Hyperglycemia, unspecified: Secondary | ICD-10-CM

## 2021-11-23 DIAGNOSIS — E041 Nontoxic single thyroid nodule: Secondary | ICD-10-CM | POA: Insufficient documentation

## 2021-11-23 DIAGNOSIS — Z1211 Encounter for screening for malignant neoplasm of colon: Secondary | ICD-10-CM | POA: Diagnosis not present

## 2021-11-23 MED ORDER — FLUOXETINE HCL 40 MG PO CAPS: 40.0000 mg | ORAL_CAPSULE | Freq: Every day | ORAL | 3 refills | Status: DC

## 2021-11-23 NOTE — Patient Instructions (Addendum)
Increase to prozac 40 mg, take 2 of 20 mg tablets for now.   I have sent in cologuard order for you should come to your home. Let me know if you do not receive this.    Your imaging for ultrasound thyroid.  Please call to schedule at following location.   [place]: [facility]- enter through medical mall entrance [address], [place], [place] [address]  Due to recent changes in healthcare laws, you may see results of your imaging and/or laboratory studies on MyChart before I have had a chance to review them.  I understand that in some cases there may be results that are confusing or concerning to you. Please understand that not all results are received at the same time and often I may need to interpret multiple results in order to provide you with the best plan of care or course of treatment. Therefore, I ask that you please give me 2 business days to thoroughly review all your results before contacting my office for clarification. Should we see a critical lab result, you will be contacted sooner.   It was a pleasure seeing you today! Please do not hesitate to reach out with any questions and or concerns.  Regards,   Mort Sawyers FNP-C

## 2021-11-23 NOTE — Progress Notes (Signed)
Established Patient Office Visit  Subjective:  Patient ID: Sheena Kennedy, female    DOB: 12-13-1955  Age: 66 y.o. MRN: [id number]  CC:  Chief Complaint  Patient presents with  . Medication Refill    HPI Sheena Kennedy is here today for follow up.   Depression: recently started on prozac which has helped with her concentration, still with lack of motivation, finding that she doesn't like to do hobbies or go out and about like she used to do. Denies SI HI. Has a lot of stressors in life. Does still follow with therapy.      11/23/2021   12:24 PM 10/19/2021    2:39 PM  PHQ9 SCORE ONLY  PHQ-9 Total Score 8 10   U/s thyroid, thyroid cyst on right side of neck, does state at times feels as though food will linger in her throat upon swallowing. Has not had u/s on thyroid since 2006.   Hyperlipidemia: trying to work on a low chol diet and exercise as able.   Past Medical History:  Diagnosis Date  . Chest pain    ETT-myoview (8/10): 7'30", 84% MPHR, stopped due to fatigue with no CP or ECG changes, EF 60%, no evidence for ischemia or infarction on perfusion images  . Hyperlipidemia   . MDD (major depressive disorder), single episode, mild (HCC)     Past Surgical History:  Procedure Laterality Date  . BONE TUMOR RESECTION  1980's   Benign in right leg  . CARPAL TUNNEL RELEASE     Right  . CYSTECTOMY     Right hand  . NERVE REPAIR     Right forearm  . VAGINAL HYSTERECTOMY  1992   Fibroids    Family History  Problem Relation Age of Onset  . Fibromyalgia Mother   . Colitis Mother   . Congestive Heart Failure Father   . Kidney disease Father   . Stomach cancer Brother   . Diabetes Brother   . Hypertension Brother   . Obesity Brother   . Stroke Brother   . Parkinson's disease Brother   . Dementia Maternal Grandmother   . Diabetes Paternal Grandmother   . Heart attack Paternal Grandfather   . Bipolar disorder Daughter   . Drug abuse Daughter   . Breast cancer  Paternal Aunt   . Hypertension Other        Multiple family members  . Cancer Other        Colon, distant on dad's side  . Depression Other        Strong hx  . Coronary artery disease Neg Hx     Social History   Socioeconomic History  . Marital status: Divorced    Spouse name: Not on file  . Number of children: 2  . Years of education: Not on file  . Highest education level: Not on file  Occupational History  . Occupation: Chief Operating Officer at [employer]: [employer]  Tobacco Use  . Smoking status: Former    Packs/day: 1.00    Years: 15.00    Total pack years: 15.00    Types: Cigarettes    Quit date: 11/27/2011    Years since quitting: 10.0  . Smokeless tobacco: Never  . Tobacco comments:    quit 11/27/11  Vaping Use  . Vaping Use: Never used  Substance and Sexual Activity  . Alcohol use: Yes    Comment: Rare  . Drug use: No  . Sexual  activity: Yes    Partners: Male    Birth control/protection: None, Post-menopausal  Other Topics Concern  . Not on file  Social History Narrative   Lives in [place] with one grand child daughter, age 67 (she has custody).   Older grand son has moved out , age 39.    Daughter (mother of children was a drug addict)   Boyfriend- lives next door   Social Determinants of Health   Financial Resource Strain: Not on file  Food Insecurity: Not on file  Transportation Needs: Not on file  Physical Activity: Not on file  Stress: Not on file  Social Connections: Not on file  Intimate Partner Violence: Not on file    Outpatient Medications Prior to Visit  Medication Sig Dispense Refill  . FLUoxetine (PROZAC) 20 MG tablet Take 1 tablet (20 mg total) by mouth daily. 90 tablet 3  . pantoprazole (PROTONIX) 20 MG tablet Take 1 tablet (20 mg total) by mouth daily. (Patient not taking: Reported on 11/23/2021) 30 tablet 0   No facility-administered medications prior to visit.    No Known Allergies        Objective:     Physical Exam Constitutional:      General: She is not in acute distress.    Appearance: Normal appearance. She is obese. She is not ill-appearing, toxic-appearing or diaphoretic.  Neck:     Thyroid: Thyromegaly (right sided) present. No thyroid tenderness.  Pulmonary:     Effort: Pulmonary effort is normal.  Neurological:     General: No focal deficit present.     Mental Status: She is alert and oriented to person, place, and time. Mental status is at baseline.  Psychiatric:        Mood and Affect: Mood normal.        Behavior: Behavior normal.        Thought Content: Thought content normal.        Judgment: Judgment normal.     BP 124/78   Pulse 62   Temp 98.6 F (37 C)   Resp 16   Ht 5' 2.5" (1.588 m)   Wt 191 lb (86.6 kg)   SpO2 97%   BMI 34.38 kg/m  Wt Readings from Last 3 Encounters:  11/23/21 191 lb (86.6 kg)  10/19/21 195 lb 5 oz (88.6 kg)  01/14/18 190 lb (86.2 kg)     Health Maintenance Due  Topic Date Due  . HIV Screening  Never done  . COLONOSCOPY (Pts 45-4yrs Insurance coverage will need to be confirmed)  Never done  . Zoster Vaccines- Shingrix (1 of 2) Never done  . COVID-19 Vaccine (3 - Pfizer series) 10/02/2019  . Pneumonia Vaccine 62+ Years old (1 - PCV) Never done  . DEXA SCAN  Never done  . TETANUS/TDAP  07/11/2021  . INFLUENZA VACCINE  10/27/2021    There are no preventive care reminders to display for this patient.  Lab Results  Component Value Date   TSH 1.08 10/19/2021   Lab Results  Component Value Date   WBC 7.0 10/19/2021   HGB 13.4 10/19/2021   HCT 39.6 10/19/2021   MCV 85.5 10/19/2021   PLT 252.0 10/19/2021   Lab Results  Component Value Date   NA 141 10/19/2021   K 3.9 10/19/2021   CO2 30 10/19/2021   GLUCOSE 86 10/19/2021   BUN 9 10/19/2021   CREATININE 0.87 10/19/2021   BILITOT 0.4 10/19/2021   ALKPHOS 73 10/19/2021   AST  15 10/19/2021   ALT 19 10/19/2021   PROT 6.5 10/19/2021   ALBUMIN 4.2 10/19/2021    CALCIUM 9.3 10/19/2021   GFR 69.91 10/19/2021   Lab Results  Component Value Date   CHOL 213 (H) 10/19/2021   Lab Results  Component Value Date   HDL 53.10 10/19/2021   Lab Results  Component Value Date   LDLCALC 140 (H) 10/19/2021   Lab Results  Component Value Date   TRIG 100.0 10/19/2021   Lab Results  Component Value Date   CHOLHDL 4 10/19/2021   Lab Results  Component Value Date   HGBA1C 5.8 10/19/2021      Assessment & Plan:   Problem List Items Addressed This Visit       Endocrine   Thyroid cyst   Relevant Orders   US THYROID   Left thyroid nodule    Palpated again on exam Ordering US thyroid pending results  tsh reviewed stable      Relevant Orders   US THYROID     Other   Hyperlipemia    Ordered lipid panel, pending results. Work on low cholesterol diet and exercise as tolerated       Relevant Orders   Lipid panel   MDD (major depressive disorder), single episode, mild (HCC)    Increase prozac to 40 mg once daily F/u with therapy as scheduled        Relevant Medications   FLUoxetine (PROZAC) 40 MG capsule   Hyperglycemia - Primary    a1c ordered pending results Pt advised of the following: Work on a diabetic diet, try to incorporate exercise at least 20-30 a day for 3 days a week or more.        Relevant Orders   Hemoglobin A1c   Screening for colon cancer    Pt refuses colonoscopy for now, prefers cologuard. (may consider colonoscopy in the future) No family history colon cancer to their knowledge. Pt denies abdominal symptoms to include diarrhea, constipation or blood in stool. cologuard order placed.       Relevant Orders   Cologuard    Meds ordered this encounter  Medications  . FLUoxetine (PROZAC) 40 MG capsule    Sig: Take 1 capsule (40 mg total) by mouth daily.    Dispense:  90 capsule    Refill:  3    Order Specific Question:   Supervising Provider    Answer:   Diona Browner, AMY E [id number]    Follow-up: Return in  about 6 months (around 05/26/2022) for regular follow up .    Eugenia Pancoast, FNP

## 2021-11-23 NOTE — Assessment & Plan Note (Signed)
Pt refuses colonoscopy for now, prefers cologuard. (may consider colonoscopy in the future) ?No family history colon cancer to their knowledge. ?Pt denies abdominal symptoms to include diarrhea, constipation or blood in stool. ?cologuard order placed. ? ?

## 2021-11-24 NOTE — Assessment & Plan Note (Signed)
Increase prozac to 40 mg once daily F/u with therapy as scheduled

## 2021-11-24 NOTE — Assessment & Plan Note (Signed)
Palpated again on exam Ordering US thyroid pending results  tsh reviewed stable

## 2021-11-24 NOTE — Assessment & Plan Note (Signed)
Ordered lipid panel, pending results. Work on low cholesterol diet and exercise as tolerated ? ?

## 2021-11-24 NOTE — Assessment & Plan Note (Signed)
a1c ordered pending results Pt advised of the following: Work on a diabetic diet, try to incorporate exercise at least 20-30 a day for 3 days a week or more.

## 2021-11-25 DIAGNOSIS — F411 Generalized anxiety disorder: Secondary | ICD-10-CM | POA: Diagnosis not present

## 2021-11-25 DIAGNOSIS — F321 Major depressive disorder, single episode, moderate: Secondary | ICD-10-CM | POA: Diagnosis not present

## 2021-12-07 ENCOUNTER — Ambulatory Visit
Admission: RE | Admit: 2021-12-07 | Discharge: 2021-12-07 | Disposition: A | Discharge status: Home / Self Care | Payer: BC Managed Care – PPO | Source: Ambulatory Visit | Attending: Family | Admitting: Family

## 2021-12-07 DIAGNOSIS — E041 Nontoxic single thyroid nodule: Secondary | ICD-10-CM | POA: Diagnosis not present

## 2021-12-07 DIAGNOSIS — E042 Nontoxic multinodular goiter: Secondary | ICD-10-CM | POA: Diagnosis not present

## 2022-01-18 ENCOUNTER — Ambulatory Visit: Payer: BC Managed Care – PPO | Admitting: Family

## 2022-01-18 ENCOUNTER — Other Ambulatory Visit (INDEPENDENT_AMBULATORY_CARE_PROVIDER_SITE_OTHER): Payer: BC Managed Care – PPO

## 2022-01-18 ENCOUNTER — Encounter: Payer: Self-pay | Admitting: Family

## 2022-01-18 VITALS — BP 134/72 | HR 68 | Temp 98.2°F | Resp 16 | Ht 62.5 in | Wt 187.0 lb

## 2022-01-18 DIAGNOSIS — R739 Hyperglycemia, unspecified: Secondary | ICD-10-CM

## 2022-01-18 DIAGNOSIS — F320 Major depressive disorder, single episode, mild: Secondary | ICD-10-CM | POA: Diagnosis not present

## 2022-01-18 DIAGNOSIS — E785 Hyperlipidemia, unspecified: Secondary | ICD-10-CM | POA: Diagnosis not present

## 2022-01-18 DIAGNOSIS — F419 Anxiety disorder, unspecified: Secondary | ICD-10-CM | POA: Diagnosis not present

## 2022-01-18 DIAGNOSIS — F32A Depression, unspecified: Secondary | ICD-10-CM

## 2022-01-18 LAB — LIPID PANEL
Cholesterol: 226 mg/dL — ABNORMAL HIGH (ref 0–200)
HDL: 52.3 mg/dL (ref >39.00)
LDL Cholesterol: 152 mg/dL — ABNORMAL HIGH (ref 0–99)
NonHDL: 173.21
Total CHOL/HDL Ratio: 4
Triglycerides: 105 mg/dL (ref 0.0–149.0)
VLDL: 21 mg/dL (ref 0.0–40.0)

## 2022-01-18 LAB — HEMOGLOBIN A1C: Hgb A1c MFr Bld: 5.9 % (ref 4.6–6.5)

## 2022-01-18 MED ORDER — BUPROPION HCL ER (XL) 150 MG PO TB24: 150.0000 mg | ORAL_TABLET | Freq: Every day | ORAL | 0 refills | Status: DC

## 2022-01-18 NOTE — Telephone Encounter (Signed)
ERROR

## 2022-01-18 NOTE — Progress Notes (Signed)
Your cholesterol has increased. I highly suggest that you start a cholesterol medication. Is pt willing?  You are also prediabetic, rising slightly. Work closely on diabetic diet exercise as tolerated.

## 2022-01-18 NOTE — Assessment & Plan Note (Signed)
Pt advised of the following: Work on a diabetic diet, try to incorporate exercise at least 20-30 a day for 3 days a week or more.  Ordered a1c pending results 

## 2022-01-18 NOTE — Assessment & Plan Note (Signed)
Continue prozac 40 mg once daily  Start wellbutrin 150 mg once daily continue with therapy

## 2022-01-18 NOTE — Patient Instructions (Addendum)
Call cologuard to inquire about the kit sitting out in the sun.   Stop by the lab prior to leaving today. I will notify you of your results once received. .  Start wellbutrin 150 mg once daily, continue prozac 40 mg once daily.  Continue with therapy once monthly as scheduled.    Regards,   Eugenia Pancoast FNP-C

## 2022-01-18 NOTE — Progress Notes (Signed)
Established Patient Office Visit  Subjective:  Patient ID: Sheena Kennedy, female    DOB: Oct 13, 1955  Age: 66 y.o. MRN: [id number]  CC:  Chief Complaint  Patient presents with   Medication Consultation    HPI Sheena Kennedy is here today for follow up.   B12 def: started taking b12 500 mcg once daily , but hasn't taken in the last few weeks.   HLD: really trying to lose weight, has cut back on what she has been eating cutting back on potatoes and bread that she usually likes. Trying to increase vegetables. Also trying to work on portion control. Not really working on exercise left knee giving her problems.   Wt Readings from Last 3 Encounters:  01/18/22 187 lb (84.8 kg)  11/23/21 191 lb (86.6 kg)  10/19/21 195 lb 5 oz (88.6 kg)   Depression/anxiety: lack of motivation. Increased to 40 mg and pt states has not felt any better, now wanting to sleep more and hard to get out of bed. Denies known seizure history. Is seeing a therapist about once monthly.     Past Medical History:  Diagnosis Date   Chest pain    ETT-myoview (8/10): 7'30", 84% MPHR, stopped due to fatigue with no CP or ECG changes, EF 60%, no evidence for ischemia or infarction on perfusion images   Hyperlipidemia    MDD (major depressive disorder), single episode, mild (HCC)     Past Surgical History:  Procedure Laterality Date   BONE TUMOR RESECTION  1980's   Benign in right leg   CARPAL TUNNEL RELEASE     Right   CYSTECTOMY     Right hand   NERVE REPAIR     Right forearm   VAGINAL HYSTERECTOMY  1992   Fibroids    Family History  Problem Relation Age of Onset   Fibromyalgia Mother    Colitis Mother    Congestive Heart Failure Father    Kidney disease Father    Stomach cancer Brother    Diabetes Brother    Hypertension Brother    Obesity Brother    Stroke Brother    Parkinson's disease Brother    Dementia Maternal Grandmother    Diabetes Paternal Grandmother    Heart attack Paternal  Grandfather    Bipolar disorder Daughter    Drug abuse Daughter    Breast cancer Paternal Aunt    Hypertension Other        Multiple family members   Cancer Other        Colon, distant on dad's side   Depression Other        Strong hx   Coronary artery disease Neg Hx     Social History   Socioeconomic History   Marital status: Divorced    Spouse name: Not on file   Number of children: 2   Years of education: Not on file   Highest education level: Not on file  Occupational History   Occupation: Chief Operating Officer at [employer]    Employer: [employer]  Tobacco Use   Smoking status: Former    Packs/day: 1.00    Years: 15.00    Total pack years: 15.00    Types: Cigarettes    Quit date: 11/27/2011    Years since quitting: 10.1   Smokeless tobacco: Never   Tobacco comments:    quit 11/27/11  Vaping Use   Vaping Use: Never used  Substance and Sexual Activity   Alcohol use: Yes  Comment: Rare   Drug use: No   Sexual activity: Yes    Partners: Male    Birth control/protection: None, Post-menopausal  Other Topics Concern   Not on file  Social History Narrative   Lives in [place] with one grand child daughter, age 56 (she has custody).   Older grand son has moved out , age 110.    Daughter (mother of children was a drug addict)   Boyfriend- lives next door   Social Determinants of Health   Financial Resource Strain: Not on file  Food Insecurity: Not on file  Transportation Needs: Not on file  Physical Activity: Not on file  Stress: Not on file  Social Connections: Not on file  Intimate Partner Violence: Not on file    Outpatient Medications Prior to Visit  Medication Sig Dispense Refill   FLUoxetine (PROZAC) 40 MG capsule Take 1 capsule (40 mg total) by mouth daily. 90 capsule 3   No facility-administered medications prior to visit.    No Known Allergies      Objective:    Physical Exam Constitutional:      General: She is not in acute distress.     Appearance: Normal appearance. She is obese. She is not ill-appearing, toxic-appearing or diaphoretic.  Cardiovascular:     Rate and Rhythm: Normal rate and regular rhythm.  Pulmonary:     Effort: Pulmonary effort is normal.     Breath sounds: Normal breath sounds.  Neurological:     General: No focal deficit present.     Mental Status: She is alert and oriented to person, place, and time. Mental status is at baseline.  Psychiatric:        Mood and Affect: Mood normal.        Behavior: Behavior normal.        Thought Content: Thought content normal.        Judgment: Judgment normal.       BP 134/72   Pulse 68   Temp 98.2 F (36.8 C)   Resp 16   Ht 5' 2.5" (1.588 m)   Wt 187 lb (84.8 kg)   SpO2 95%   BMI 33.66 kg/m  Wt Readings from Last 3 Encounters:  01/18/22 187 lb (84.8 kg)  11/23/21 191 lb (86.6 kg)  10/19/21 195 lb 5 oz (88.6 kg)     Health Maintenance Due  Topic Date Due   HIV Screening  Never done   COLONOSCOPY (Pts 45-56yrs Insurance coverage will need to be confirmed)  Never done   Zoster Vaccines- Shingrix (1 of 2) Never done   COVID-19 Vaccine (3 - Pfizer series) 10/02/2019   Pneumonia Vaccine 32+ Years old (1 - PCV) Never done   DEXA SCAN  Never done   TETANUS/TDAP  07/11/2021   INFLUENZA VACCINE  10/27/2021    There are no preventive care reminders to display for this patient.  Lab Results  Component Value Date   TSH 1.08 10/19/2021   Lab Results  Component Value Date   WBC 7.0 10/19/2021   HGB 13.4 10/19/2021   HCT 39.6 10/19/2021   MCV 85.5 10/19/2021   PLT 252.0 10/19/2021   Lab Results  Component Value Date   NA 141 10/19/2021   K 3.9 10/19/2021   CO2 30 10/19/2021   GLUCOSE 86 10/19/2021   BUN 9 10/19/2021   CREATININE 0.87 10/19/2021   BILITOT 0.4 10/19/2021   ALKPHOS 73 10/19/2021   AST 15 10/19/2021   ALT 19  10/19/2021   PROT 6.5 10/19/2021   ALBUMIN 4.2 10/19/2021   CALCIUM 9.3 10/19/2021   GFR 69.91 10/19/2021   Lab  Results  Component Value Date   CHOL 213 (H) 10/19/2021   Lab Results  Component Value Date   HDL 53.10 10/19/2021   Lab Results  Component Value Date   LDLCALC 140 (H) 10/19/2021   Lab Results  Component Value Date   TRIG 100.0 10/19/2021   Lab Results  Component Value Date   CHOLHDL 4 10/19/2021   Lab Results  Component Value Date   HGBA1C 5.8 10/19/2021      Assessment & Plan:   Problem List Items Addressed This Visit       Other   Hyperlipemia    Ordered lipid panel, pending results. Work on low cholesterol diet and exercise as tolerated       MDD (major depressive disorder), single episode, mild (HCC)    Continue prozac 40 mg once daily  Start wellbutrin 150 mg once daily continue with therapy        Relevant Medications   buPROPion (WELLBUTRIN XL) 150 MG 24 hr tablet   Hyperglycemia    Pt advised of the following: Work on a diabetic diet, try to incorporate exercise at least 20-30 a day for 3 days a week or more.  Ordered a1c pending results      Other Visit Diagnoses     Anxiety and depression    -  Primary   Relevant Medications   buPROPion (WELLBUTRIN XL) 150 MG 24 hr tablet       Meds ordered this encounter  Medications   buPROPion (WELLBUTRIN XL) 150 MG 24 hr tablet    Sig: Take 1 tablet (150 mg total) by mouth daily.    Dispense:  90 tablet    Refill:  0    Order Specific Question:   Supervising Provider    Answer:   Diona Browner, AMY E [[id number]    Follow-up: Return in about 2 months (around 03/20/2022) for f/u medication for depression.    Eugenia Pancoast, FNP

## 2022-01-18 NOTE — Assessment & Plan Note (Signed)
Ordered lipid panel, pending results. Work on low cholesterol diet and exercise as tolerated ? ?

## 2022-01-20 ENCOUNTER — Other Ambulatory Visit: Payer: Self-pay | Admitting: Family

## 2022-01-20 ENCOUNTER — Telehealth: Payer: Self-pay | Admitting: Family

## 2022-01-20 DIAGNOSIS — E785 Hyperlipidemia, unspecified: Secondary | ICD-10-CM

## 2022-01-20 MED ORDER — ATORVASTATIN CALCIUM 10 MG PO TABS: 10.0000 mg | ORAL_TABLET | Freq: Every day | ORAL | 3 refills | Status: DC

## 2022-01-20 NOTE — Telephone Encounter (Signed)
Pt returning your call regarding lab results would like a call back [phone]

## 2022-01-20 NOTE — Telephone Encounter (Signed)
Spoke to pt about lab results.

## 2022-04-17 ENCOUNTER — Other Ambulatory Visit: Payer: Self-pay | Admitting: Family

## 2022-04-17 DIAGNOSIS — F32A Depression, unspecified: Secondary | ICD-10-CM

## 2022-05-26 ENCOUNTER — Ambulatory Visit: Payer: BC Managed Care – PPO | Admitting: Family

## 2022-05-26 ENCOUNTER — Encounter: Payer: Self-pay | Admitting: Family

## 2022-05-26 VITALS — BP 130/74 | HR 76 | Temp 97.7°F | Ht 62.5 in | Wt 192.6 lb

## 2022-05-26 DIAGNOSIS — E785 Hyperlipidemia, unspecified: Secondary | ICD-10-CM

## 2022-05-26 DIAGNOSIS — F320 Major depressive disorder, single episode, mild: Secondary | ICD-10-CM

## 2022-05-26 DIAGNOSIS — Z6834 Body mass index (BMI) 34.0-34.9, adult: Secondary | ICD-10-CM

## 2022-05-26 DIAGNOSIS — R7303 Prediabetes: Secondary | ICD-10-CM

## 2022-05-26 DIAGNOSIS — R739 Hyperglycemia, unspecified: Secondary | ICD-10-CM

## 2022-05-26 DIAGNOSIS — E66811 Obesity, class 1: Secondary | ICD-10-CM

## 2022-05-26 DIAGNOSIS — E6609 Other obesity due to excess calories: Secondary | ICD-10-CM | POA: Diagnosis not present

## 2022-05-26 DIAGNOSIS — R41840 Attention and concentration deficit: Secondary | ICD-10-CM

## 2022-05-26 NOTE — Patient Instructions (Signed)
  A referral was placed today for weight loss clinic.  Please let us know if you have not heard back within 2 weeks about the referral.   Regards,   Elanda Garmany FNP-C

## 2022-05-26 NOTE — Assessment & Plan Note (Signed)
Pt advised to work on diet and exercise as tolerated Did refer pt to weight loss management work on nutrition and advised pt to incorporate exercise in her regular routine.

## 2022-05-26 NOTE — Assessment & Plan Note (Signed)
Pt advised of the following: Work on a diabetic diet, try to incorporate exercise at least 20-30 a day for 3 days a week or more.

## 2022-05-26 NOTE — Assessment & Plan Note (Signed)
Pt to work on compliance with taking medication daily and trying not to skip days.  Recommendation for pt to f/u with therapy, she states she will consider this.

## 2022-05-26 NOTE — Assessment & Plan Note (Signed)
Handout given for low cholesterol diet.  Advised pt to swap out cream in coffee for low cholesterol alternative.  Ordered lipid panel, pending results. Work on low cholesterol diet and exercise as tolerated

## 2022-05-26 NOTE — Assessment & Plan Note (Signed)
Again recommended that if pt would like evaluation for ADD can refer to psychiatry. For now pt declines.

## 2022-05-26 NOTE — Progress Notes (Signed)
Established Patient Office Visit  Subjective:      CC: No chief complaint on file.   HPI: Sheena Kennedy is a 67 y.o. female presenting on 05/26/2022 for No chief complaint on file. .  MDD: last visit added on wellbutrin 150 mg once daily along with continued prozac 40 mg once daily. Has noticed slight improvement, but states has not been taking her medication like she should. Skips medication a few times a week. She has placed next to her coffee pot, and trying to become more regular. She states she is easily agitated especially at work can increase her stress pretty quickly. She has not f/u with her therapist in the last five months or so.      05/26/2022    9:39 AM  GAD 7 : Generalized Anxiety Score  Nervous, Anxious, on Edge 1  Control/stop worrying 1  Worry too much - different things 0  Trouble relaxing 0  Restless 0  Easily annoyed or irritable 1  Afraid - awful might happen 0  Total GAD 7 Score 3  Anxiety Difficulty Very difficult       05/26/2022    9:39 AM 11/23/2021   12:24 PM 10/19/2021    2:39 PM  PHQ9 SCORE ONLY  PHQ-9 Total Score '6 8 10    '$ Prediabetes:  Lab Results  Component Value Date   HGBA1C 5.9 01/18/2022   Prediabetes: frustrated because se lost about 16 pounds and then has since gained it back. She states she tries to choose healthier options, but she is not exercising at current.   Wt Readings from Last 3 Encounters:  05/26/22 192 lb 9.6 oz (87.4 kg)  01/18/22 187 lb (84.8 kg)  11/23/21 191 lb (86.6 kg)         Social history:  Relevant past medical, surgical, family and social history reviewed and updated as indicated. Interim medical history since our last visit reviewed.  Allergies and medications reviewed and updated.  DATA REVIEWED: CHART IN EPIC     ROS: Negative unless specifically indicated above in HPI.    Current Outpatient Medications:    atorvastatin (LIPITOR) 10 MG tablet, Take 1 tablet (10 mg total) by mouth  daily., Disp: 90 tablet, Rfl: 3   buPROPion (WELLBUTRIN XL) 150 MG 24 hr tablet, TAKE 1 TABLET BY MOUTH EVERY DAY, Disp: 90 tablet, Rfl: 0   FLUoxetine (PROZAC) 40 MG capsule, Take 1 capsule (40 mg total) by mouth daily., Disp: 90 capsule, Rfl: 3      Objective:    BP 130/74   Pulse 76   Temp 97.7 F (36.5 C) (Temporal)   Ht 5' 2.5" (1.588 m)   Wt 192 lb 9.6 oz (87.4 kg)   SpO2 98%   BMI 34.67 kg/m   Wt Readings from Last 3 Encounters:  05/26/22 192 lb 9.6 oz (87.4 kg)  01/18/22 187 lb (84.8 kg)  11/23/21 191 lb (86.6 kg)    Physical Exam Constitutional:      General: She is not in acute distress.    Appearance: Normal appearance. She is normal weight. She is not ill-appearing, toxic-appearing or diaphoretic.  HENT:     Head: Normocephalic.  Cardiovascular:     Rate and Rhythm: Normal rate and regular rhythm.  Pulmonary:     Effort: Pulmonary effort is normal.  Musculoskeletal:        General: Normal range of motion.     Right lower leg: No edema.  Left lower leg: No edema.  Neurological:     General: No focal deficit present.     Mental Status: She is alert and oriented to person, place, and time. Mental status is at baseline.  Psychiatric:        Mood and Affect: Mood normal.        Behavior: Behavior normal.        Thought Content: Thought content normal.        Judgment: Judgment normal.           Assessment & Plan:  Class 1 obesity due to excess calories with serious comorbidity and body mass index (BMI) of 34.0 to 34.9 in adult Assessment & Plan: Pt advised to work on diet and exercise as tolerated Did refer pt to weight loss management work on nutrition and advised pt to incorporate exercise in her regular routine.   Orders: -     Amb Ref to Medical Weight Management  Hyperlipidemia, unspecified hyperlipidemia type -     Lipid panel; Future  Prediabetes -     Hemoglobin A1c; Future  Lack of concentration Assessment & Plan: Again  recommended that if pt would like evaluation for ADD can refer to psychiatry. For now pt declines.   Hyperglycemia Assessment & Plan: Pt advised of the following: Work on a diabetic diet, try to incorporate exercise at least 20-30 a day for 3 days a week or more.     MDD (major depressive disorder), single episode, mild ([place]) Assessment & Plan: Pt to work on compliance with taking medication daily and trying not to skip days.  Recommendation for pt to f/u with therapy, she states she will consider this.       Return in about 6 months (around 11/24/2022) for f/u CPE.  Eugenia Pancoast, MSN, APRN, FNP-C [facility]

## 2022-06-03 ENCOUNTER — Other Ambulatory Visit (INDEPENDENT_AMBULATORY_CARE_PROVIDER_SITE_OTHER): Payer: BC Managed Care – PPO

## 2022-06-03 DIAGNOSIS — R7303 Prediabetes: Secondary | ICD-10-CM | POA: Diagnosis not present

## 2022-06-03 DIAGNOSIS — E785 Hyperlipidemia, unspecified: Secondary | ICD-10-CM

## 2022-06-03 LAB — HEMOGLOBIN A1C: Hgb A1c MFr Bld: 5.8 % (ref 4.6–6.5)

## 2022-06-03 LAB — LIPID PANEL
Cholesterol: 195 mg/dL (ref 0–200)
HDL: 48.2 mg/dL (ref >39.00)
LDL Cholesterol: 133 mg/dL — ABNORMAL HIGH (ref 0–99)
NonHDL: 146.87
Total CHOL/HDL Ratio: 4
Triglycerides: 71 mg/dL (ref 0.0–149.0)
VLDL: 14.2 mg/dL (ref 0.0–40.0)

## 2022-06-04 ENCOUNTER — Other Ambulatory Visit: Payer: Self-pay | Admitting: Family

## 2022-06-04 DIAGNOSIS — E785 Hyperlipidemia, unspecified: Secondary | ICD-10-CM

## 2022-06-04 MED ORDER — ATORVASTATIN CALCIUM 10 MG PO TABS: 10.0000 mg | ORAL_TABLET | Freq: Every day | ORAL | 3 refills | Status: DC

## 2022-07-18 ENCOUNTER — Other Ambulatory Visit: Payer: Self-pay | Admitting: Family

## 2022-07-18 DIAGNOSIS — F32A Depression, unspecified: Secondary | ICD-10-CM

## 2022-07-26 DIAGNOSIS — S40021A Contusion of right upper arm, initial encounter: Secondary | ICD-10-CM | POA: Diagnosis not present

## 2022-07-26 DIAGNOSIS — S4991XA Unspecified injury of right shoulder and upper arm, initial encounter: Secondary | ICD-10-CM | POA: Diagnosis not present

## 2022-07-26 DIAGNOSIS — M545 Low back pain, unspecified: Secondary | ICD-10-CM | POA: Diagnosis not present

## 2022-09-28 DIAGNOSIS — M62830 Muscle spasm of back: Secondary | ICD-10-CM | POA: Diagnosis not present

## 2022-10-15 ENCOUNTER — Other Ambulatory Visit: Payer: Self-pay | Admitting: Family

## 2022-10-15 DIAGNOSIS — F320 Major depressive disorder, single episode, mild: Secondary | ICD-10-CM

## 2022-10-15 NOTE — Telephone Encounter (Signed)
Increased to 40 mg last visit

## 2022-10-16 ENCOUNTER — Other Ambulatory Visit: Payer: Self-pay | Admitting: Family

## 2022-10-16 DIAGNOSIS — F32A Depression, unspecified: Secondary | ICD-10-CM

## 2022-10-27 ENCOUNTER — Encounter (INDEPENDENT_AMBULATORY_CARE_PROVIDER_SITE_OTHER): Payer: Self-pay

## 2022-11-03 DIAGNOSIS — D2262 Melanocytic nevi of left upper limb, including shoulder: Secondary | ICD-10-CM | POA: Diagnosis not present

## 2022-11-03 DIAGNOSIS — D225 Melanocytic nevi of trunk: Secondary | ICD-10-CM | POA: Diagnosis not present

## 2022-11-03 DIAGNOSIS — L718 Other rosacea: Secondary | ICD-10-CM | POA: Diagnosis not present

## 2022-11-03 DIAGNOSIS — Z85828 Personal history of other malignant neoplasm of skin: Secondary | ICD-10-CM | POA: Diagnosis not present

## 2022-11-03 DIAGNOSIS — D485 Neoplasm of uncertain behavior of skin: Secondary | ICD-10-CM | POA: Diagnosis not present

## 2022-11-03 DIAGNOSIS — D2261 Melanocytic nevi of right upper limb, including shoulder: Secondary | ICD-10-CM | POA: Diagnosis not present

## 2022-11-24 ENCOUNTER — Other Ambulatory Visit: Payer: Self-pay | Admitting: Family

## 2022-11-24 ENCOUNTER — Encounter: Payer: Self-pay | Admitting: Family

## 2022-11-24 ENCOUNTER — Ambulatory Visit (INDEPENDENT_AMBULATORY_CARE_PROVIDER_SITE_OTHER): Payer: BC Managed Care – PPO | Admitting: Family

## 2022-11-24 VITALS — BP 138/80 | HR 77 | Temp 97.2°F | Ht 62.0 in | Wt 180.0 lb

## 2022-11-24 DIAGNOSIS — Z1211 Encounter for screening for malignant neoplasm of colon: Secondary | ICD-10-CM | POA: Insufficient documentation

## 2022-11-24 DIAGNOSIS — Z780 Asymptomatic menopausal state: Secondary | ICD-10-CM | POA: Diagnosis not present

## 2022-11-24 DIAGNOSIS — E538 Deficiency of other specified B group vitamins: Secondary | ICD-10-CM

## 2022-11-24 DIAGNOSIS — E6609 Other obesity due to excess calories: Secondary | ICD-10-CM

## 2022-11-24 DIAGNOSIS — F320 Major depressive disorder, single episode, mild: Secondary | ICD-10-CM | POA: Diagnosis not present

## 2022-11-24 DIAGNOSIS — F411 Generalized anxiety disorder: Secondary | ICD-10-CM

## 2022-11-24 DIAGNOSIS — Z9189 Other specified personal risk factors, not elsewhere classified: Secondary | ICD-10-CM

## 2022-11-24 DIAGNOSIS — Z87891 Personal history of nicotine dependence: Secondary | ICD-10-CM

## 2022-11-24 DIAGNOSIS — R7303 Prediabetes: Secondary | ICD-10-CM

## 2022-11-24 DIAGNOSIS — Z8639 Personal history of other endocrine, nutritional and metabolic disease: Secondary | ICD-10-CM

## 2022-11-24 DIAGNOSIS — Z0001 Encounter for general adult medical examination with abnormal findings: Secondary | ICD-10-CM | POA: Diagnosis not present

## 2022-11-24 DIAGNOSIS — Z1231 Encounter for screening mammogram for malignant neoplasm of breast: Secondary | ICD-10-CM

## 2022-11-24 DIAGNOSIS — E785 Hyperlipidemia, unspecified: Secondary | ICD-10-CM

## 2022-11-24 LAB — COMPREHENSIVE METABOLIC PANEL
ALT: 15 U/L (ref 0–35)
AST: 16 U/L (ref 0–37)
Albumin: 3.7 g/dL (ref 3.5–5.2)
Alkaline Phosphatase: 60 U/L (ref 39–117)
BUN: 15 mg/dL (ref 6–23)
CO2: 33 mEq/L — ABNORMAL HIGH (ref 19–32)
Calcium: 9.4 mg/dL (ref 8.4–10.5)
Chloride: 100 mEq/L (ref 96–112)
Creatinine, Ser: 0.8 mg/dL (ref 0.40–1.20)
GFR: 76.72 mL/min (ref >60.00)
Glucose, Bld: 89 mg/dL (ref 70–99)
Potassium: 3.8 mEq/L (ref 3.5–5.1)
Sodium: 141 mEq/L (ref 135–145)
Total Bilirubin: 0.6 mg/dL (ref 0.2–1.2)
Total Protein: 6.5 g/dL (ref 6.0–8.3)

## 2022-11-24 LAB — VITAMIN B12: Vitamin B-12: 890 pg/mL (ref 211–911)

## 2022-11-24 LAB — LIPID PANEL
Cholesterol: 181 mg/dL (ref 0–200)
HDL: 33.4 mg/dL — ABNORMAL LOW (ref >39.00)
LDL Cholesterol: 128 mg/dL — ABNORMAL HIGH (ref 0–99)
NonHDL: 147.13
Total CHOL/HDL Ratio: 5
Triglycerides: 98 mg/dL (ref 0.0–149.0)
VLDL: 19.6 mg/dL (ref 0.0–40.0)

## 2022-11-24 LAB — TSH: TSH: 0.32 u[IU]/mL — ABNORMAL LOW (ref 0.35–5.50)

## 2022-11-24 LAB — HEMOGLOBIN A1C: Hgb A1c MFr Bld: 5.9 % (ref 4.6–6.5)

## 2022-11-24 MED ORDER — FLUOXETINE HCL 20 MG PO TABS: 20.0000 mg | ORAL_TABLET | Freq: Every day | ORAL | 3 refills | Status: DC

## 2022-11-24 MED ORDER — WEGOVY 0.5 MG/0.5ML ~~LOC~~ SOAJ: 0.5000 mg | SUBCUTANEOUS | 1 refills | Status: DC

## 2022-11-24 NOTE — Patient Instructions (Addendum)
I have sent an electronic order over to your preferred location for the following:   []   2D Mammogram  [x]   3D Mammogram  [x]   Bone Density   Please give this center a call to get scheduled at your convenience.  [x]   [facility]  [address] [place] [address]  [phone]  Make sure to wear two piece  clothing  No lotions powders or deodorants the day of the appointment Make sure to bring picture ID and insurance card.  Bring list of medications you are currently taking including any supplements.   ------------------------------------  A referral was placed today for Gi to set up your colonoscopy.  Please let us know if you have not heard back within 1 week about your referral.   ------------------------------------  Restart prozac 20 mg and also wellbutrin 150 mg

## 2022-11-24 NOTE — Assessment & Plan Note (Signed)
Ordered lipid panel, pending results. Work on low cholesterol diet and exercise as tolerated  

## 2022-11-24 NOTE — Assessment & Plan Note (Signed)
Referral to lung cancer screening program made.  

## 2022-11-24 NOTE — Progress Notes (Signed)
Subjective:  Patient ID: Sheena Kennedy, female    DOB: Apr 30, 1955  Age: 67 y.o. MRN: [id number]  Patient Care Team: Mort Sawyers, FNP as PCP - General (Family Medicine)   CC:  Chief Complaint  Patient presents with   Annual Exam    HPI Sheena Kennedy is a 67 y.o. female who presents today for an annual physical exam. She reports consuming a general diet.  Not really exercising  She generally feels fairly well. She reports sleeping fairly well. She does not have additional problems to discuss today.   Vision:Within last year Dental:Receives regular dental care  Lung Cancer Screening with low-dose Chest CT: will place referral today  - Adults age 51-80 who are current cigarette smokers or quit within the last 15 years. Must have 20 pack year history.   Quit 2013 had been a smoker since age 38  Mammogram: 11/19/22 Last pap: > 44 y/o  Colonoscopy:placing referral  Bone density scan: placing referral   Pt is without acute concerns.   Anxiety depression: stopped prozac 40 mg once daily and wellbutrin 150 mg once daily abruptly two weeks, just didn't feel like taking any of her medications. Has noted increased lack of concentration since stopping.   Lab Results  Component Value Date   CHOL 195 06/03/2022   HDL 48.20 06/03/2022   LDLCALC 133 (H) 06/03/2022   LDLDIRECT 166.3 07/12/2011   TRIG 71.0 06/03/2022   CHOLHDL 4 06/03/2022    Advanced Directives Patient does not have advanced directives  DEPRESSION SCREENING    11/24/2022   10:58 AM 05/26/2022    9:39 AM 11/23/2021   12:24 PM 10/19/2021    2:39 PM  PHQ 2/9 Scores  PHQ - 2 Score 0 2 2 2   PHQ- 9 Score 0 6 8 10      ROS: Negative unless specifically indicated above in HPI.    Current Outpatient Medications:    FLUoxetine (PROZAC) 20 MG tablet, Take 1 tablet (20 mg total) by mouth daily., Disp: 90 tablet, Rfl: 3   Semaglutide-Weight Management (WEGOVY) 0.5 MG/0.5ML SOAJ, Inject 0.5 mg into the skin once a  week., Disp: 4 mL, Rfl: 1   atorvastatin (LIPITOR) 10 MG tablet, Take 1 tablet (10 mg total) by mouth daily. (Patient not taking: Reported on 11/24/2022), Disp: 90 tablet, Rfl: 3   buPROPion (WELLBUTRIN XL) 150 MG 24 hr tablet, TAKE 1 TABLET BY MOUTH EVERY DAY (Patient not taking: Reported on 11/24/2022), Disp: 90 tablet, Rfl: 3    Objective:    BP 138/80 (BP Location: Left Arm, Patient Position: Sitting, Cuff Size: Normal)   Pulse 77   Temp (!) 97.2 F (36.2 C) (Oral)   Ht 5\' 2"  (1.575 m)   Wt 180 lb (81.6 kg)   SpO2 96%   BMI 32.92 kg/m   BP Readings from Last 3 Encounters:  11/24/22 138/80  05/26/22 130/74  01/18/22 134/72      Physical Exam Vitals reviewed.  Constitutional:      General: She is not in acute distress.    Appearance: Normal appearance. She is obese. She is not ill-appearing.  HENT:     Head: Normocephalic.     Right Ear: Tympanic membrane normal.     Left Ear: Tympanic membrane normal.     Nose: Nose normal.     Mouth/Throat:     Mouth: Mucous membranes are moist.  Eyes:     Extraocular Movements: Extraocular movements intact.     Pupils:  Pupils are equal, round, and reactive to light.  Cardiovascular:     Rate and Rhythm: Normal rate and regular rhythm.  Pulmonary:     Effort: Pulmonary effort is normal.     Breath sounds: Normal breath sounds.  Abdominal:     General: Abdomen is flat. Bowel sounds are normal.     Palpations: Abdomen is soft.     Tenderness: There is no guarding or rebound.  Musculoskeletal:        General: Normal range of motion.     Cervical back: Normal range of motion.  Skin:    General: Skin is warm.     Capillary Refill: Capillary refill takes less than 2 seconds.  Neurological:     General: No focal deficit present.     Mental Status: She is alert.  Psychiatric:        Mood and Affect: Mood normal.        Behavior: Behavior normal.        Thought Content: Thought content normal.        Judgment: Judgment normal.           Assessment & Plan:  Anxiety state -     FLUoxetine HCl; Take 1 tablet (20 mg total) by mouth daily.  Dispense: 90 tablet; Refill: 3  MDD (major depressive disorder), single episode, mild (HCC) Assessment & Plan: Restart prozac 20 mg once daily and wellbutrin 150 mg XL  Counseled on abruptly stopping  Orders: -     FLUoxetine HCl; Take 1 tablet (20 mg total) by mouth daily.  Dispense: 90 tablet; Refill: 3  Screening mammogram for breast cancer -     3D Screening Mammogram, Left and Right; Future  Postmenopausal -     DG Bone Density; Future  History of tobacco abuse Assessment & Plan: Referral to lung cancer screening program made.   Orders: -     Ambulatory Referral for Lung Cancer Scre  Screening for colon cancer -     Ambulatory referral to Gastroenterology  Encounter for general adult medical examination with abnormal findings Assessment & Plan: Patient Counseling(The following topics were reviewed):  Preventative care handout given to pt  Health maintenance and immunizations reviewed. Please refer to Health maintenance section. Pt advised on safe sex, wearing seatbelts in car, and proper nutrition labwork ordered today for annual Dental health: Discussed importance of regular tooth brushing, flossing, and dental visits.   Orders: -     Comprehensive metabolic panel -     Lipid panel  Prediabetes Assessment & Plan: Pt on wegovy through compounding pharmacy will try to get covered through current insurance, not on medicare on ppo bcbs. The beneficiary does not have any FDA labeled contraindications to the requested agent including pregnancy, lactation, h/o medullary thyroid cancer or multiple endocrine neoplasia type II.   Rx wegovy 0.5 mg weekly   Orders: -     Comprehensive metabolic panel -     Hemoglobin A1c -     Wegovy; Inject 0.5 mg into the skin once a week.  Dispense: 4 mL; Refill: 1  History of hyperthyroidism -     TSH  Class 1  obesity due to excess calories with serious comorbidity and body mass index (BMI) of 34.0 to 34.9 in adult Assessment & Plan: Pt advised to work on diet and exercise as tolerated   Orders: -     ZOXWRU; Inject 0.5 mg into the skin once a week.  Dispense: 4 mL; Refill:  1  At risk for diabetes mellitus -     Wegovy; Inject 0.5 mg into the skin once a week.  Dispense: 4 mL; Refill: 1  Low serum vitamin B12 -     Vitamin B12  Hyperlipidemia, unspecified hyperlipidemia type Assessment & Plan: Ordered lipid panel, pending results. Work on low cholesterol diet and exercise as tolerated     Pt declines today tetanus, shingles, prevnar and also flu because she will be getting them next week.  Wt Readings from Last 3 Encounters:  11/24/22 180 lb (81.6 kg)  05/26/22 192 lb 9.6 oz (87.4 kg)  01/18/22 187 lb (84.8 kg)   +   Follow-up: Return in about 6 months (around 05/27/2023) for f/u cholesterol.   Mort Sawyers, FNP

## 2022-11-24 NOTE — Assessment & Plan Note (Signed)
Pt on wegovy through compounding pharmacy will try to get covered through current insurance, not on medicare on ppo bcbs. The beneficiary does not have any FDA labeled contraindications to the requested agent including pregnancy, lactation, h/o medullary thyroid cancer or multiple endocrine neoplasia type II.   Rx wegovy 0.5 mg weekly

## 2022-11-24 NOTE — Assessment & Plan Note (Signed)
Pt advised to work on diet and exercise as tolerated  

## 2022-11-24 NOTE — Assessment & Plan Note (Signed)
Restart prozac 20 mg once daily and wellbutrin 150 mg XL  Counseled on abruptly stopping

## 2022-11-24 NOTE — Assessment & Plan Note (Signed)

## 2022-11-25 ENCOUNTER — Other Ambulatory Visit: Payer: Self-pay | Admitting: Family

## 2022-11-25 ENCOUNTER — Ambulatory Visit (INDEPENDENT_AMBULATORY_CARE_PROVIDER_SITE_OTHER): Payer: BC Managed Care – PPO

## 2022-11-25 DIAGNOSIS — Z8639 Personal history of other endocrine, nutritional and metabolic disease: Secondary | ICD-10-CM

## 2022-11-25 DIAGNOSIS — E785 Hyperlipidemia, unspecified: Secondary | ICD-10-CM

## 2022-11-25 MED ORDER — ATORVASTATIN CALCIUM 20 MG PO TABS: 20.0000 mg | ORAL_TABLET | Freq: Every day | ORAL | 3 refills | Status: DC

## 2022-11-25 NOTE — Telephone Encounter (Signed)
Can we try a prior auth for wegovy?

## 2022-11-26 ENCOUNTER — Other Ambulatory Visit (HOSPITAL_COMMUNITY): Payer: Self-pay

## 2022-11-26 ENCOUNTER — Telehealth: Payer: Self-pay

## 2022-11-26 ENCOUNTER — Other Ambulatory Visit: Payer: Self-pay

## 2022-11-26 DIAGNOSIS — Z1211 Encounter for screening for malignant neoplasm of colon: Secondary | ICD-10-CM

## 2022-11-26 LAB — T4, FREE: Free T4: 1.17 ng/dL (ref 0.60–1.60)

## 2022-11-26 LAB — T3, FREE: T3, Free: 4.3 pg/mL — ABNORMAL HIGH (ref 2.3–4.2)

## 2022-11-26 MED ORDER — NA SULFATE-K SULFATE-MG SULF 17.5-3.13-1.6 GM/177ML PO SOLN
1.0000 | Freq: Once | ORAL | 0 refills | Status: AC
Start: 2022-11-26 — End: 2022-11-26

## 2022-11-26 NOTE — Telephone Encounter (Signed)
Gastroenterology Pre-Procedure Review  Request Date: 01/17/23 Requesting Physician: Dr. Tobi Bastos  PATIENT REVIEW QUESTIONS: The patient responded to the following health history questions as indicated:    1. Are you having any GI issues? no 2. Do you have a personal history of Polyps? no 3. Do you have a family history of Colon Cancer or Polyps?  Mothers side of family specific family members unknown 4. Diabetes Mellitus? no 5. Joint replacements in the past 12 months?no 6. Major health problems in the past 3 months?no 7. Any artificial heart valves, MVP, or defibrillator?no    MEDICATIONS & ALLERGIES:    Patient reports the following regarding taking any anticoagulation/antiplatelet therapy:   Plavix, Coumadin, Eliquis, Xarelto, Lovenox, Pradaxa, Brilinta, or Effient? no Aspirin? no  Patient confirms/reports the following medications:  Current Outpatient Medications  Medication Sig Dispense Refill   baclofen (LIORESAL) 10 MG tablet Take 10 mg by mouth 3 (three) times daily.     SOOLANTRA 1 % CREA Apply topically.     atorvastatin (LIPITOR) 20 MG tablet Take 1 tablet (20 mg total) by mouth daily. 90 tablet 3   buPROPion (WELLBUTRIN XL) 150 MG 24 hr tablet TAKE 1 TABLET BY MOUTH EVERY DAY (Patient not taking: Reported on 11/24/2022) 90 tablet 3   FLUoxetine (PROZAC) 20 MG tablet Take 1 tablet (20 mg total) by mouth daily. 90 tablet 3   Semaglutide-Weight Management (WEGOVY) 0.5 MG/0.5ML SOAJ Inject 0.5 mg into the skin once a week. 4 mL 1   No current facility-administered medications for this visit.    Patient confirms/reports the following allergies:  No Known Allergies  No orders of the defined types were placed in this encounter.   AUTHORIZATION INFORMATION Primary Insurance: 1D#: Group #:  Secondary Insurance: 1D#: Group #:  SCHEDULE INFORMATION: Date: 01/17/23 Time: Location: [facility]

## 2022-11-26 NOTE — Telephone Encounter (Signed)
Per test claim, weight loss drugs are not covered, plan/benefit exclusion. No PA submitted at this time. Please see test claim below:

## 2022-11-30 ENCOUNTER — Other Ambulatory Visit: Payer: Self-pay | Admitting: Family

## 2022-11-30 ENCOUNTER — Encounter: Payer: Self-pay | Admitting: *Deleted

## 2022-11-30 DIAGNOSIS — R7989 Other specified abnormal findings of blood chemistry: Secondary | ICD-10-CM

## 2022-12-22 DIAGNOSIS — R946 Abnormal results of thyroid function studies: Secondary | ICD-10-CM | POA: Diagnosis not present

## 2022-12-22 DIAGNOSIS — R1310 Dysphagia, unspecified: Secondary | ICD-10-CM | POA: Diagnosis not present

## 2022-12-22 DIAGNOSIS — R7989 Other specified abnormal findings of blood chemistry: Secondary | ICD-10-CM | POA: Diagnosis not present

## 2022-12-22 DIAGNOSIS — E042 Nontoxic multinodular goiter: Secondary | ICD-10-CM | POA: Diagnosis not present

## 2022-12-28 ENCOUNTER — Other Ambulatory Visit: Payer: Self-pay | Admitting: Obstetrics and Gynecology

## 2022-12-28 DIAGNOSIS — Z780 Asymptomatic menopausal state: Secondary | ICD-10-CM

## 2022-12-28 DIAGNOSIS — R7303 Prediabetes: Secondary | ICD-10-CM

## 2022-12-28 DIAGNOSIS — E785 Hyperlipidemia, unspecified: Secondary | ICD-10-CM

## 2022-12-28 DIAGNOSIS — Z1211 Encounter for screening for malignant neoplasm of colon: Secondary | ICD-10-CM

## 2022-12-28 DIAGNOSIS — Z9189 Other specified personal risk factors, not elsewhere classified: Secondary | ICD-10-CM

## 2022-12-28 DIAGNOSIS — E538 Deficiency of other specified B group vitamins: Secondary | ICD-10-CM

## 2022-12-28 DIAGNOSIS — Z1231 Encounter for screening mammogram for malignant neoplasm of breast: Secondary | ICD-10-CM

## 2022-12-28 DIAGNOSIS — E66811 Obesity, class 1: Secondary | ICD-10-CM

## 2022-12-28 DIAGNOSIS — F411 Generalized anxiety disorder: Secondary | ICD-10-CM

## 2022-12-28 DIAGNOSIS — Z0001 Encounter for general adult medical examination with abnormal findings: Secondary | ICD-10-CM

## 2022-12-28 DIAGNOSIS — Z8639 Personal history of other endocrine, nutritional and metabolic disease: Secondary | ICD-10-CM

## 2022-12-28 DIAGNOSIS — F320 Major depressive disorder, single episode, mild: Secondary | ICD-10-CM

## 2022-12-28 DIAGNOSIS — Z87891 Personal history of nicotine dependence: Secondary | ICD-10-CM

## 2023-01-06 ENCOUNTER — Encounter: Payer: Self-pay | Admitting: Gastroenterology

## 2023-01-10 ENCOUNTER — Encounter: Payer: Self-pay | Admitting: Gastroenterology

## 2023-01-11 ENCOUNTER — Telehealth: Payer: Self-pay

## 2023-01-11 NOTE — Telephone Encounter (Signed)
Pt requested call back to cancel procedure would like a call back to reschedule

## 2023-01-11 NOTE — Telephone Encounter (Signed)
Patient apologizes that she has to cancel her 01/17/23 colonoscopy with Dr. Tobi Bastos.  She said she will need to call back to reschedule when she gets back in town.  Trish in Endo has been notified of cancellation.  Thanks,  [place], [place]

## 2023-01-17 ENCOUNTER — Ambulatory Visit: Admit: 2023-01-17 | Payer: BC Managed Care – PPO | Admitting: Gastroenterology

## 2023-01-17 HISTORY — DX: Anxiety disorder, unspecified: F41.9

## 2023-01-17 SURGERY — COLONOSCOPY WITH PROPOFOL
Anesthesia: General

## 2023-03-10 ENCOUNTER — Ambulatory Visit
Admission: RE | Admit: 2023-03-10 | Discharge: 2023-03-10 | Disposition: A | Discharge status: Home / Self Care | Payer: BC Managed Care – PPO | Source: Ambulatory Visit | Attending: Family | Admitting: Family

## 2023-03-10 DIAGNOSIS — Z780 Asymptomatic menopausal state: Secondary | ICD-10-CM | POA: Diagnosis not present

## 2023-03-10 DIAGNOSIS — Z1231 Encounter for screening mammogram for malignant neoplasm of breast: Secondary | ICD-10-CM | POA: Diagnosis not present

## 2023-03-10 DIAGNOSIS — M810 Age-related osteoporosis without current pathological fracture: Secondary | ICD-10-CM | POA: Diagnosis not present

## 2023-03-11 ENCOUNTER — Encounter: Payer: Self-pay | Admitting: Family

## 2023-03-11 DIAGNOSIS — M810 Age-related osteoporosis without current pathological fracture: Secondary | ICD-10-CM | POA: Insufficient documentation

## 2023-03-15 ENCOUNTER — Encounter: Payer: Self-pay | Admitting: Family

## 2023-05-02 ENCOUNTER — Encounter: Payer: Self-pay | Admitting: Family

## 2023-05-02 DIAGNOSIS — M816 Localized osteoporosis [Lequesne]: Secondary | ICD-10-CM

## 2023-05-02 MED ORDER — ALENDRONATE SODIUM 70 MG PO TABS
70.0000 mg | ORAL_TABLET | ORAL | 11 refills | Status: DC
Start: 2023-05-02 — End: 2024-02-06

## 2023-06-23 ENCOUNTER — Ambulatory Visit
Admission: EM | Admit: 2023-06-23 | Discharge: 2023-06-23 | Disposition: A | Discharge status: Home / Self Care | Attending: Emergency Medicine | Admitting: Emergency Medicine

## 2023-06-23 DIAGNOSIS — B029 Zoster without complications: Secondary | ICD-10-CM | POA: Diagnosis not present

## 2023-06-23 MED ORDER — VALACYCLOVIR HCL 1 G PO TABS
1000.0000 mg | ORAL_TABLET | Freq: Three times a day (TID) | ORAL | 0 refills | Status: DC
Start: 2023-06-23 — End: 2024-02-06

## 2023-06-23 NOTE — Discharge Instructions (Addendum)
Take the Valtrex as directed.  Follow up with your primary care provider.    

## 2023-06-23 NOTE — ED Triage Notes (Signed)
 Patient to Urgent Care with complaints of blistering rash present to back/ abdomen.  Symptoms started approx one week ago with  burning back in her back. Then approx 4 days ago started developing blistering/ painful rash.

## 2023-06-23 NOTE — ED Provider Notes (Signed)
 [facility]    CSN: [id number] Arrival date & time: 06/23/23  1120      History   Chief Complaint Chief Complaint  Patient presents with   Rash    HPI Sheena Kennedy is a 68 y.o. female.  Patient presents with painful blistering rash on her abdomen and back x 4 days.  The area became painful a week ago and then she developed the rash.  The rash started on her back and has continued to spread to her abdomen.  No OTC medications taken today.  No fever or purulent drainage.  The history is provided by the patient and medical records.    Past Medical History:  Diagnosis Date   Anxiety    Chest pain    ETT-myoview (8/10): 7'30", 84% MPHR, stopped due to fatigue with no CP or ECG changes, EF 60%, no evidence for ischemia or infarction on perfusion images   Hyperlipidemia    MDD (major depressive disorder), single episode, mild (HCC)     Patient Active Problem List   Diagnosis Date Noted   Osteoporosis 03/11/2023   History of hyperthyroidism 11/24/2022   Prediabetes 11/24/2022   Postmenopausal 11/24/2022   History of tobacco abuse 11/24/2022   Class 1 obesity due to excess calories with serious comorbidity and body mass index (BMI) of 34.0 to 34.9 in adult 05/26/2022   Left thyroid nodule 11/23/2021   GERD (gastroesophageal reflux disease) 06/20/2017   MDD (major depressive disorder), single episode, mild (HCC)    Hyperlipemia 12/20/2008   Anxiety state 11/05/2008    Past Surgical History:  Procedure Laterality Date   BONE TUMOR RESECTION  1980's   Benign in right leg   CARPAL TUNNEL RELEASE     Right   CYSTECTOMY     Right hand   NERVE REPAIR     Right forearm   VAGINAL HYSTERECTOMY  1992   Fibroids    OB History   No obstetric history on file.      Home Medications    Prior to Admission medications   Medication Sig Start Date End Date Taking? Authorizing Provider  valACYclovir (VALTREX) 1000 MG tablet Take 1 tablet (1,000 mg total) by mouth  3 (three) times daily. 06/23/23  Yes Mickie Bail, NP  alendronate (FOSAMAX) 70 MG tablet Take 1 tablet (70 mg total) by mouth every 7 (seven) days. Take with a full glass of water on an empty stomach. 05/02/23   Mort Sawyers, FNP  atorvastatin (LIPITOR) 20 MG tablet Take 1 tablet (20 mg total) by mouth daily. 11/25/22   Mort Sawyers, FNP  baclofen (LIORESAL) 10 MG tablet Take 10 mg by mouth 3 (three) times daily. 09/28/22   [provider]  buPROPion (WELLBUTRIN XL) 150 MG 24 hr tablet TAKE 1 TABLET BY MOUTH EVERY DAY Patient not taking: Reported on 11/24/2022 10/18/22   Mort Sawyers, FNP  FLUoxetine (PROZAC) 20 MG tablet Take 1 tablet (20 mg total) by mouth daily. 11/24/22   Mort Sawyers, FNP  Semaglutide-Weight Management (WEGOVY) 0.5 MG/0.5ML SOAJ Inject 0.5 mg into the skin once a week. 11/24/22   Mort Sawyers, FNP  SOOLANTRA 1 % CREA Apply topically. 11/05/22   [provider]    Family History Family History  Problem Relation Age of Onset   Fibromyalgia Mother    Colitis Mother    Congestive Heart Failure Father    Kidney disease Father    Stomach cancer Brother    Diabetes Brother  Hypertension Brother    Obesity Brother    Stroke Brother    Parkinson's disease Brother    Dementia Maternal Grandmother    Diabetes Paternal Grandmother    Heart attack Paternal Grandfather    Bipolar disorder Daughter    Drug abuse Daughter    Breast cancer Paternal Aunt    Hypertension Other        Multiple family members   Cancer Other        Colon, distant on dad's side   Depression Other        Strong hx   Coronary artery disease Neg Hx     Social History Social History   Tobacco Use   Smoking status: Former    Current packs/day: 0.00    Average packs/day: 1 pack/day for 15.0 years (15.0 ttl pk-yrs)    Types: Cigarettes    Start date: 11/26/1996    Quit date: 11/27/2011    Years since quitting: 11.5   Smokeless tobacco: Never   Tobacco comments:    quit  11/27/11  Vaping Use   Vaping status: Never Used  Substance Use Topics   Alcohol use: Yes    Comment: Rare   Drug use: No     Allergies   Patient has no known allergies.   Review of Systems Review of Systems  Constitutional:  Negative for chills and fever.  Skin:  Positive for color change and rash.     Physical Exam Triage Vital Signs ED Triage Vitals  Encounter Vitals Group     BP      Systolic BP Percentile      Diastolic BP Percentile      Pulse      Resp      Temp      Temp src      SpO2      Weight      Height      Head Circumference      Peak Flow      Pain Score      Pain Loc      Pain Education      Exclude from Growth Chart    No data found.  Updated Vital Signs BP 125/80   Pulse 85   Temp 98.5 F (36.9 C)   Resp 18   SpO2 95%   Visual Acuity Right Eye Distance:   Left Eye Distance:   Bilateral Distance:    Right Eye Near:   Left Eye Near:    Bilateral Near:     Physical Exam Constitutional:      General: She is not in acute distress. HENT:     Mouth/Throat:     Mouth: Mucous membranes are moist.  Cardiovascular:     Rate and Rhythm: Normal rate and regular rhythm.  Pulmonary:     Effort: Pulmonary effort is normal. No respiratory distress.  Skin:    General: Skin is warm and dry.     Findings: Rash present.     Comments: Patchy vesicular rash on left side of abdomen and back along dermatome line.  Neurological:     Mental Status: She is alert.      UC Treatments / Results  Labs (all labs ordered are listed, but only abnormal results are displayed) Labs Reviewed - No data to display  EKG   Radiology No results found.  Procedures Procedures (including critical care time)  Medications Ordered in UC Medications - No data to display  Initial  Impression / Assessment and Plan / UC Course  I have reviewed the triage vital signs and the nursing notes.  Pertinent labs & imaging results that were available during my  care of the patient were reviewed by me and considered in my medical decision making (see chart for details).    Herpes zoster.  Afebrile and vital signs are stable.  Treating shingles with Valtrex.  Tylenol as needed.  Education provided on shingles.  Instructed patient to follow-up with her PCP.  ED precautions given.  She agrees to plan of care.  Final Clinical Impressions(s) / UC Diagnoses   Final diagnoses:  Herpes zoster without complication     Discharge Instructions      Take the Valtrex as directed.  Follow up with your primary care provider.      ED Prescriptions     Medication Sig Dispense Auth. Provider   valACYclovir (VALTREX) 1000 MG tablet Take 1 tablet (1,000 mg total) by mouth 3 (three) times daily. 21 tablet Mickie Bail, NP      PDMP not reviewed this encounter.   Mickie Bail, NP 06/23/23 [phone]

## 2023-11-18 DIAGNOSIS — D2261 Melanocytic nevi of right upper limb, including shoulder: Secondary | ICD-10-CM | POA: Diagnosis not present

## 2023-11-18 DIAGNOSIS — D2262 Melanocytic nevi of left upper limb, including shoulder: Secondary | ICD-10-CM | POA: Diagnosis not present

## 2023-11-18 DIAGNOSIS — D2272 Melanocytic nevi of left lower limb, including hip: Secondary | ICD-10-CM | POA: Diagnosis not present

## 2023-11-18 DIAGNOSIS — D225 Melanocytic nevi of trunk: Secondary | ICD-10-CM | POA: Diagnosis not present

## 2023-11-18 DIAGNOSIS — Z85828 Personal history of other malignant neoplasm of skin: Secondary | ICD-10-CM | POA: Diagnosis not present

## 2023-11-18 DIAGNOSIS — L218 Other seborrheic dermatitis: Secondary | ICD-10-CM | POA: Diagnosis not present

## 2024-01-30 ENCOUNTER — Encounter: Payer: Self-pay | Admitting: Family

## 2024-02-06 ENCOUNTER — Encounter: Payer: Self-pay | Admitting: Family

## 2024-02-06 ENCOUNTER — Ambulatory Visit (INDEPENDENT_AMBULATORY_CARE_PROVIDER_SITE_OTHER): Admitting: Family

## 2024-02-06 ENCOUNTER — Ambulatory Visit: Payer: Self-pay | Admitting: Family

## 2024-02-06 VITALS — BP 132/82 | HR 77 | Temp 97.9°F | Ht 62.0 in | Wt 183.0 lb

## 2024-02-06 DIAGNOSIS — Z23 Encounter for immunization: Secondary | ICD-10-CM

## 2024-02-06 DIAGNOSIS — E785 Hyperlipidemia, unspecified: Secondary | ICD-10-CM | POA: Diagnosis not present

## 2024-02-06 DIAGNOSIS — M816 Localized osteoporosis [Lequesne]: Secondary | ICD-10-CM | POA: Diagnosis not present

## 2024-02-06 DIAGNOSIS — Z0000 Encounter for general adult medical examination without abnormal findings: Secondary | ICD-10-CM

## 2024-02-06 DIAGNOSIS — F320 Major depressive disorder, single episode, mild: Secondary | ICD-10-CM

## 2024-02-06 DIAGNOSIS — R7303 Prediabetes: Secondary | ICD-10-CM | POA: Diagnosis not present

## 2024-02-06 DIAGNOSIS — F32A Depression, unspecified: Secondary | ICD-10-CM | POA: Insufficient documentation

## 2024-02-06 DIAGNOSIS — Z1231 Encounter for screening mammogram for malignant neoplasm of breast: Secondary | ICD-10-CM

## 2024-02-06 DIAGNOSIS — F419 Anxiety disorder, unspecified: Secondary | ICD-10-CM | POA: Diagnosis not present

## 2024-02-06 DIAGNOSIS — Z8639 Personal history of other endocrine, nutritional and metabolic disease: Secondary | ICD-10-CM

## 2024-02-06 DIAGNOSIS — K219 Gastro-esophageal reflux disease without esophagitis: Secondary | ICD-10-CM | POA: Diagnosis not present

## 2024-02-06 DIAGNOSIS — Z1211 Encounter for screening for malignant neoplasm of colon: Secondary | ICD-10-CM

## 2024-02-06 DIAGNOSIS — F411 Generalized anxiety disorder: Secondary | ICD-10-CM | POA: Diagnosis not present

## 2024-02-06 LAB — T3, FREE: T3, Free: 3.2 pg/mL (ref 2.3–4.2)

## 2024-02-06 LAB — LIPID PANEL
Cholesterol: 234 mg/dL — ABNORMAL HIGH (ref 0–200)
HDL: 60.8 mg/dL (ref >39.00)
LDL Cholesterol: 152 mg/dL — ABNORMAL HIGH (ref 0–99)
NonHDL: 173.59
Total CHOL/HDL Ratio: 4
Triglycerides: 109 mg/dL (ref 0.0–149.0)
VLDL: 21.8 mg/dL (ref 0.0–40.0)

## 2024-02-06 LAB — BASIC METABOLIC PANEL WITH GFR
BUN: 13 mg/dL (ref 6–23)
CO2: 32 meq/L (ref 19–32)
Calcium: 9.6 mg/dL (ref 8.4–10.5)
Chloride: 102 meq/L (ref 96–112)
Creatinine, Ser: 0.78 mg/dL (ref 0.40–1.20)
GFR: 78.42 mL/min (ref >60.00)
Glucose, Bld: 93 mg/dL (ref 70–99)
Potassium: 4.1 meq/L (ref 3.5–5.1)
Sodium: 141 meq/L (ref 135–145)

## 2024-02-06 LAB — HEMOGLOBIN A1C: Hgb A1c MFr Bld: 5.7 % (ref 4.6–6.5)

## 2024-02-06 LAB — TSH: TSH: 0.95 u[IU]/mL (ref 0.35–5.50)

## 2024-02-06 LAB — T4, FREE: Free T4: 0.63 ng/dL (ref 0.60–1.60)

## 2024-02-06 MED ORDER — OMEPRAZOLE 20 MG PO CPDR: 20.0000 mg | DELAYED_RELEASE_CAPSULE | Freq: Every day | ORAL | 0 refills | Status: DC

## 2024-02-06 MED ORDER — BUPROPION HCL ER (XL) 150 MG PO TB24: 150.0000 mg | ORAL_TABLET | Freq: Every day | ORAL | 3 refills | Status: AC

## 2024-02-06 MED ORDER — ATORVASTATIN CALCIUM 20 MG PO TABS: 20.0000 mg | ORAL_TABLET | Freq: Every day | ORAL | 3 refills | Status: AC

## 2024-02-06 MED ORDER — FLUOXETINE HCL 20 MG PO TABS: 20.0000 mg | ORAL_TABLET | Freq: Every day | ORAL | 3 refills | Status: AC

## 2024-02-06 MED ORDER — ALENDRONATE SODIUM 70 MG PO TABS: 70.0000 mg | ORAL_TABLET | ORAL | 11 refills | Status: AC

## 2024-02-06 NOTE — Patient Instructions (Addendum)
  Restart atorvastatin  and Prozac  week one, can start Wellbutrin  week two as long as tolerated well. Restart fosamax  week 3.   ------------------------------------  Recommendation for TDAP and Shingles vaccination  ------------------------------------  I have sent an electronic order over to your preferred location for the following:   []   2D Mammogram  [x]   3D Mammogram  [x]   Bone Density   Please give this center a call to get scheduled at your convenience.  [x]   [facility]  [address] [place] [address]  [phone]  Make sure to wear two piece  clothing  No lotions powders or deodorants the day of the appointment Make sure to bring picture ID and insurance card.  Bring list of medications you are currently taking including any supplements.

## 2024-02-06 NOTE — Progress Notes (Signed)
 Subjective:   Sheena Kennedy is a 68 y.o. female who presents for a Welcome to [facility] Exam.   Discussed the use of AI scribe software for clinical note transcription with the patient, who gave verbal consent to proceed.  History of Present Illness Sheena Kennedy is a 68 year old female who presents for medication management and stress-related concerns.  Discussed the use of AI scribe software for clinical note transcription with the patient, who gave verbal consent to proceed.  History of Present Illness Sheena Kennedy is a 68 year old female who presents for medication management and stress-related concerns.  She has discontinued all her medications, including atorvastatin  for cholesterol, Prozac  for stress, and Fosamax  for osteoporosis. Increased stress due to her boyfriend's recent blindness and the return of her two grandchildren to her home has contributed to her decision to stop her medications.  She has a history of osteoporosis with a T-score of -3.4 in her spine. She previously took Fosamax  but only managed to take four doses due to forgetfulness. She experiences weakness in her knees, making it difficult to rise from a seated position without using her arms. No pain in the knees is reported.  She feels overwhelmed by the changes in her household and the demands of caring for her family members. She has not been taking her medications, including Prozac .  She has not had a recent A1c check. She has not had a tetanus shot since 2013 and is due for a flu vaccine.   Lab Results  Component Value Date   CHOL 181 11/24/2022   HDL 33.40 (L) 11/24/2022   LDLCALC 128 (H) 11/24/2022   LDLDIRECT 166.3 07/12/2011   TRIG 98.0 11/24/2022   CHOLHDL 5 11/24/2022    Allergies (verified) Patient has no known allergies.   History: Past Medical History:  Diagnosis Date   Anxiety    Chest pain    ETT-myoview (8/10): 7'30, 84% MPHR, stopped due to fatigue with no CP or ECG  changes, EF 60%, no evidence for ischemia or infarction on perfusion images   Hyperlipidemia    MDD (major depressive disorder), single episode, mild    Past Surgical History:  Procedure Laterality Date   BONE TUMOR RESECTION  1980's   Benign in right leg   CARPAL TUNNEL RELEASE     Right   CYSTECTOMY     Right hand   NERVE REPAIR     Right forearm   VAGINAL HYSTERECTOMY  1992   Fibroids   Family History  Problem Relation Age of Onset   Fibromyalgia Mother    Colitis Mother    Congestive Heart Failure Father    Kidney disease Father    Stomach cancer Brother    Diabetes Brother    Hypertension Brother    Obesity Brother    Stroke Brother    Parkinson's disease Brother    Dementia Maternal Grandmother    Diabetes Paternal Grandmother    Heart attack Paternal Grandfather    Bipolar disorder Daughter    Drug abuse Daughter    Breast cancer Paternal Aunt    Hypertension Other        Multiple family members   Cancer Other        Colon, distant on dad's side   Depression Other        Strong hx   Coronary artery disease Neg Hx    Social History   Occupational History   Occupation: Chief Operating Officer at [employer]  Employer: [employer]  Tobacco Use   Smoking status: Former    Current packs/day: 0.00    Average packs/day: 1 pack/day for 15.0 years (15.0 ttl pk-yrs)    Types: Cigarettes    Start date: 11/26/1996    Quit date: 11/27/2011    Years since quitting: 12.2   Smokeless tobacco: Never   Tobacco comments:    quit 11/27/11  Vaping Use   Vaping status: Never Used  Substance and Sexual Activity   Alcohol use: Yes    Comment: Rare   Drug use: No   Sexual activity: Yes    Partners: Male    Birth control/protection: None, Post-menopausal   Tobacco Counseling Counseling given: Not Answered Tobacco comments: quit 11/27/11  SDOH Screenings   Food Insecurity: No Food Insecurity (02/06/2024)  Housing: Unknown (02/06/2024)  Transportation Needs: Unknown  (02/06/2024)  Utilities: Not At Risk (02/06/2024)  Depression (PHQ2-9): Medium Risk (02/06/2024)  Physical Activity: Inactive (02/06/2024)  Social Connections: Socially Isolated (02/06/2024)  Stress: Stress Concern Present (02/06/2024)  Tobacco Use: Medium Risk (02/06/2024)  Health Literacy: Adequate Health Literacy (02/06/2024)   Depression Screen    02/06/2024    9:27 AM 11/24/2022   10:58 AM 05/26/2022    9:39 AM 11/23/2021   12:24 PM 10/19/2021    2:39 PM  PHQ 2/9 Scores  PHQ - 2 Score 3 0 2 2 2   PHQ- 9 Score 9 0  6  8  10       Data saved with a previous flowsheet row definition     Goals Addressed   None    Visit info / Clinical Intake: Medicare Wellness Visit Type:: Welcome to [employer] [facility] [employer]) Medicare Wellness Visit Mode:: In-person (required for WTM) Interpreter Needed?: No Pre-visit prep was completed: no AWV questionnaire completed by patient prior to visit?: no Living arrangements:: lives with spouse/significant other; with family/others Patient's Overall Health Status Rating: good Typical amount of pain: none Does pain affect daily life?: no Are you currently prescribed opioids?: no  Dietary Habits and Nutritional Risks How many meals a day?: (!) 1 Eats fruit and vegetables daily?: (!) no Most meals are obtained by: eating out Diabetic:: no  Functional Status Activities of Daily Living (to include ambulation/medication): Independent Ambulation: Independent Medication Administration: Independent Home Management: Independent Manage your own finances?: yes Primary transportation is: driving Concerns about vision?: no *vision screening is required for WTM* Concerns about hearing?: no  Fall Screening Falls in the past year?: 0 Number of falls in past year: 0 Was there an injury with Fall?: 0 Fall Risk Category Calculator: 0 Patient Fall Risk Level: Low Fall Risk  Fall Risk Patient at Risk for Falls Due to: No Fall Risks Fall risk Follow up: Falls  evaluation completed  Home and Transportation Safety: All rugs have non-skid backing?: (!) no All stairs or steps have railings?: yes Grab bars in the bathtub or shower?: yes Have non-skid surface in bathtub or shower?: yes Good home lighting?: yes Regular seat belt use?: yes Hospital stays in the last year:: no  Cognitive Assessment Difficulty concentrating, remembering, or making decisions? : yes Will 6CIT or Mini Cog be Completed: yes  Advance Directives (For Healthcare) Does Patient Have a Medical Advance Directive?: No         Objective:    Today's Vitals   02/06/24 0928  BP: 132/82  Pulse: 77  Temp: 97.9 F (36.6 C)  TempSrc: Temporal  SpO2: 97%  Weight: 183 lb (83 kg)  Height: 5'  2 (1.575 m)   Body mass index is 33.47 kg/m.   Physical Exam Vitals reviewed.  Constitutional:      General: She is not in acute distress.    Appearance: Normal appearance. She is normal weight. She is not ill-appearing, toxic-appearing or diaphoretic.  HENT:     Head: Normocephalic.  Cardiovascular:     Rate and Rhythm: Normal rate and regular rhythm.  Pulmonary:     Effort: Pulmonary effort is normal.  Musculoskeletal:        General: Normal range of motion.     Right lower leg: No edema.     Left lower leg: No edema.  Neurological:     General: No focal deficit present.     Mental Status: She is alert and oriented to person, place, and time. Mental status is at baseline.  Psychiatric:        Mood and Affect: Mood normal.        Behavior: Behavior normal.        Thought Content: Thought content normal.        Judgment: Judgment normal.      Current Medications (verified) Outpatient Encounter Medications as of 02/06/2024  Medication Sig   omeprazole (PRILOSEC) 20 MG capsule Take 1 capsule (20 mg total) by mouth daily.   alendronate  (FOSAMAX ) 70 MG tablet Take 1 tablet (70 mg total) by mouth every 7 (seven) days. Take with a full glass of water on an empty stomach.    atorvastatin  (LIPITOR) 20 MG tablet Take 1 tablet (20 mg total) by mouth daily.   buPROPion  (WELLBUTRIN  XL) 150 MG 24 hr tablet Take 1 tablet (150 mg total) by mouth daily.   FLUoxetine  (PROZAC ) 20 MG tablet Take 1 tablet (20 mg total) by mouth daily.   [DISCONTINUED] alendronate  (FOSAMAX ) 70 MG tablet Take 1 tablet (70 mg total) by mouth every 7 (seven) days. Take with a full glass of water on an empty stomach.   [DISCONTINUED] atorvastatin  (LIPITOR) 20 MG tablet Take 1 tablet (20 mg total) by mouth daily.   [DISCONTINUED] baclofen (LIORESAL) 10 MG tablet Take 10 mg by mouth 3 (three) times daily.   [DISCONTINUED] buPROPion  (WELLBUTRIN  XL) 150 MG 24 hr tablet TAKE 1 TABLET BY MOUTH EVERY DAY (Patient not taking: Reported on 11/24/2022)   [DISCONTINUED] FLUoxetine  (PROZAC ) 20 MG tablet Take 1 tablet (20 mg total) by mouth daily.   [DISCONTINUED] Semaglutide -Weight Management (WEGOVY ) 0.5 MG/0.5ML SOAJ Inject 0.5 mg into the skin once a week.   [DISCONTINUED] SOOLANTRA 1 % CREA Apply topically.   [DISCONTINUED] valACYclovir  (VALTREX ) 1000 MG tablet Take 1 tablet (1,000 mg total) by mouth 3 (three) times daily.   No facility-administered encounter medications on file as of 02/06/2024.   Hearing/Vision screen Hearing Screening  Method: Audiometry   500Hz  1000Hz  2000Hz  4000Hz   Right ear 20 20 20 20   Left ear 20 20 20 20    Vision Screening   Right eye Left eye Both eyes  Without correction 20/30 20/30 20/25   With correction      Immunizations and Health Maintenance Health Maintenance  Topic Date Due   Fecal DNA (Cologuard)  Never done   Pneumococcal Vaccine: 50+ Years (1 of 1 - PCV) Never done   DTaP/Tdap/Td (2 - Td or Tdap) 07/11/2021   Influenza Vaccine  10/28/2023   COVID-19 Vaccine (3 - 2025-26 season) 02/22/2024 (Originally 11/28/2023)   Zoster Vaccines- Shingrix (1 of 2) 05/08/2024 (Originally 05/13/2006)   Medicare Annual Wellness (AWV)  02/05/2025   Mammogram  03/09/2025   DEXA  SCAN  Completed   Hepatitis C Screening  Completed   Meningococcal B Vaccine  Aged Out    EKG: n/a asymptomatic      Assessment/Plan:  This is a routine wellness examination for Victor.  Assessment and Plan Assessment & Plan Adult Wellness Visit Routine wellness visit with emphasis on stress management and self-care due to increased household stressors. - Scheduled mammogram - Referred to therapy for stress management  Osteoporosis of spine Severe osteoporosis with a T-score of -3.4 in the spine, indicating high fracture risk. Previous Fosamax  use was discontinued due to forgetfulness. Discussed Fosamax 's role in preventing fractures and improving bone density. - Restart Fosamax  in week three - Set reminders for weekly Fosamax  intake - Will schedule annual bone density scan  Generalized anxiety disorder and depression Increased stress due to household changes. Previously on Prozac  and Wellbutrin , which were discontinued. Discussed restarting medications to manage stress and mood. - Restart Prozac  in week one - Restart Wellbutrin  in week two if tolerating Prozac  well -referral placed for therapy  Hyperlipidemia Previously on atorvastatin , which was discontinued. Discussed restarting medication to manage cholesterol levels. - Restart atorvastatin   Obesity, BMI 34-34.9 Class 1 obesity with a BMI of 34-34.9. Discussed weight management and potential impact on joint health.  Gastroesophageal reflux disease (GERD) Reports of heartburn and cough, possibly related to GERD. Nasal examination showed swelling, indicating allergies may contribute to symptoms. - Try Prilosec for two weeks to manage heartburn - Use allergy medications for two weeks to reduce nasal swelling  General Health Maintenance Discussed vaccinations and screenings. Due for tetanus, shingles, and pneumonia vaccines. Cologuard recommended for colon cancer screening due to family history. - Administered flu vaccine  today - Recommended tetanus, shingles, and pneumonia vaccines at pharmacy - Ordered Cologuard for colon cancer screening    Patient Care Team: Corwin Antu, FNP as PCP - General (Family Medicine)  I have personally reviewed and noted the following in the patient's chart:   Medical and social history Use of alcohol, tobacco or illicit drugs  Current medications and supplements including opioid prescriptions. Functional ability and status Nutritional status Physical activity Advanced directives List of other physicians Hospitalizations, surgeries, and ER visits in previous 12 months Vitals Screenings to include cognitive, depression, and falls Referrals and appointments  Orders Placed This Encounter  Procedures   DG Bone Density    Standing Status:   Future    Expiration Date:   02/05/2025    Reason for Exam (SYMPTOM  OR DIAGNOSIS REQUIRED):   osteoporosis    Preferred imaging location?:   [facility]   MM 3D SCREENING MAMMOGRAM BILATERAL BREAST    Standing Status:   Future    Expiration Date:   02/05/2025    Reason for Exam (SYMPTOM  OR DIAGNOSIS REQUIRED):   screening for breast cancer    [facility]erred [facility]   Hemoglobin A1c   TSH   Lipid panel   Basic Metabolic Panel   T3, Free   T4, free   Cologuard   Ambulatory referral to Psychology    Referral Priority:   Routine    Referral Type:   Psychiatric    Referral Reason:   Specialty Services Required    Requested Specialty:   Psychology    Number of Visits Requested:   1   In addition, I have reviewed and discussed with patient certain preventive protocols, quality metrics, and best practice  recommendations. A written personalized care plan for preventive services as well as general preventive health recommendations were provided to patient.   Ginger Patrick, FNP   02/06/2024   Return in about 3 months (around 05/08/2024) for f/u anxiety.

## 2024-02-29 ENCOUNTER — Other Ambulatory Visit: Payer: Self-pay | Admitting: Family

## 2024-02-29 DIAGNOSIS — K219 Gastro-esophageal reflux disease without esophagitis: Secondary | ICD-10-CM

## 2024-03-09 ENCOUNTER — Telehealth: Payer: Self-pay | Admitting: Family

## 2024-03-09 LAB — COLOGUARD: COLOGUARD: NEGATIVE

## 2024-03-09 NOTE — Telephone Encounter (Signed)
 Spoke with pt and advised her that she can contact [facility] to have her bone density done at the same time as her mammogram. She verbalized understanding. Nothing further was needed.

## 2024-03-09 NOTE — Telephone Encounter (Signed)
 Copied from CRM #[id number]. Topic: Referral - Status >> Mar 09, 2024  8:47 AM Tiffini S wrote: Reason for CRM:  Patient called about bone density test- states that she have scheduled appointment for 03/20/24 mammograph but haven't heard anything back about the test  Please call the patient at [phone] to update

## 2024-03-20 ENCOUNTER — Ambulatory Visit
Admission: RE | Admit: 2024-03-20 | Discharge: 2024-03-20 | Disposition: A | Discharge status: Home / Self Care | Source: Ambulatory Visit | Attending: Family | Admitting: Family

## 2024-03-20 DIAGNOSIS — Z1231 Encounter for screening mammogram for malignant neoplasm of breast: Secondary | ICD-10-CM | POA: Diagnosis present

## 2024-05-08 ENCOUNTER — Ambulatory Visit: Admitting: Family
